# Patient Record
Sex: Female | Born: 1960
Health system: Southern US, Community
[De-identification: ages and names within clinical notes are randomized; demographics above are authoritative.]

## PROBLEM LIST (undated history)

## (undated) DIAGNOSIS — Z972 Presence of dental prosthetic device (complete) (partial): Secondary | ICD-10-CM

## (undated) DIAGNOSIS — I10 Essential (primary) hypertension: Secondary | ICD-10-CM

## (undated) DIAGNOSIS — H9190 Unspecified hearing loss, unspecified ear: Secondary | ICD-10-CM

## (undated) DIAGNOSIS — E049 Nontoxic goiter, unspecified: Secondary | ICD-10-CM

## (undated) HISTORY — PX: MULTIPLE TOOTH EXTRACTIONS: SHX2053

## (undated) HISTORY — DX: Nontoxic goiter, unspecified: E04.9

## (undated) HISTORY — PX: CYST EXCISION: SHX5701

## (undated) HISTORY — PX: TUBAL LIGATION: SHX77

## (undated) HISTORY — DX: Essential (primary) hypertension: I10

---

## 2008-02-11 ENCOUNTER — Emergency Department (HOSPITAL_BASED_OUTPATIENT_CLINIC_OR_DEPARTMENT_OTHER): Admission: EM | Admit: 2008-02-11 | Discharge: 2008-02-11 | Payer: Self-pay | Admitting: Emergency Medicine

## 2009-09-21 ENCOUNTER — Encounter: Admission: RE | Admit: 2009-09-21 | Discharge: 2009-09-21 | Payer: Self-pay | Admitting: Emergency Medicine

## 2010-06-30 ENCOUNTER — Encounter: Payer: Self-pay | Admitting: Emergency Medicine

## 2010-09-18 ENCOUNTER — Other Ambulatory Visit: Payer: Self-pay | Admitting: Emergency Medicine

## 2010-09-18 DIAGNOSIS — E041 Nontoxic single thyroid nodule: Secondary | ICD-10-CM

## 2010-09-30 ENCOUNTER — Other Ambulatory Visit: Payer: Self-pay

## 2010-09-30 ENCOUNTER — Ambulatory Visit
Admission: RE | Admit: 2010-09-30 | Discharge: 2010-09-30 | Disposition: A | Discharge status: Home / Self Care | Payer: 59 | Source: Ambulatory Visit | Attending: Emergency Medicine | Admitting: Emergency Medicine

## 2010-09-30 DIAGNOSIS — E041 Nontoxic single thyroid nodule: Secondary | ICD-10-CM

## 2010-10-03 ENCOUNTER — Other Ambulatory Visit: Payer: Self-pay | Admitting: Emergency Medicine

## 2010-10-03 DIAGNOSIS — E041 Nontoxic single thyroid nodule: Secondary | ICD-10-CM

## 2010-10-30 ENCOUNTER — Other Ambulatory Visit: Payer: Self-pay | Admitting: Interventional Radiology

## 2010-10-30 ENCOUNTER — Other Ambulatory Visit (HOSPITAL_COMMUNITY)
Admission: RE | Admit: 2010-10-30 | Discharge: 2010-10-30 | Disposition: A | Discharge status: Home / Self Care | Payer: 59 | Source: Ambulatory Visit | Attending: Interventional Radiology | Admitting: Interventional Radiology

## 2010-10-30 ENCOUNTER — Ambulatory Visit
Admission: RE | Admit: 2010-10-30 | Discharge: 2010-10-30 | Disposition: A | Discharge status: Home / Self Care | Payer: 59 | Source: Ambulatory Visit | Attending: Emergency Medicine | Admitting: Emergency Medicine

## 2010-10-30 DIAGNOSIS — E049 Nontoxic goiter, unspecified: Secondary | ICD-10-CM | POA: Insufficient documentation

## 2010-10-30 DIAGNOSIS — E041 Nontoxic single thyroid nodule: Secondary | ICD-10-CM

## 2011-09-23 ENCOUNTER — Encounter: Payer: Self-pay | Admitting: Emergency Medicine

## 2011-10-24 ENCOUNTER — Telehealth: Payer: Self-pay

## 2011-10-24 NOTE — Telephone Encounter (Signed)
Patient out of hypertension RX (Lisinopril) but next appt for CPE not until July. Requests refill until then. Advised may take 24-48 hours for response, might need to come in and be seen before a doc will refill.

## 2011-10-25 NOTE — Telephone Encounter (Signed)
PT IS VERY UPSET THAT NO ONE HAS CALLED HER REGARDING REFILLING HER HIGH BLOOD PRESSURE MEDICATION YET, CAN SOMEONE PLEASE HELP THIS PT SHE STATES THAT SHE IS COMPLETELY OUT.

## 2011-10-26 MED ORDER — LISINOPRIL-HYDROCHLOROTHIAZIDE 20-12.5 MG PO TABS: 1.0000 | ORAL_TABLET | Freq: Every day | ORAL | Status: DC

## 2011-10-26 NOTE — Telephone Encounter (Signed)
LMOM notifying patient rx sent in. 

## 2011-12-23 ENCOUNTER — Encounter: Payer: Self-pay | Admitting: Emergency Medicine

## 2011-12-23 ENCOUNTER — Other Ambulatory Visit: Payer: Self-pay | Admitting: Emergency Medicine

## 2011-12-23 ENCOUNTER — Ambulatory Visit (INDEPENDENT_AMBULATORY_CARE_PROVIDER_SITE_OTHER): Payer: 59 | Admitting: Emergency Medicine

## 2011-12-23 VITALS — BP 139/96 | HR 83 | Temp 98.3°F | Resp 18 | Ht 65.5 in | Wt 186.6 lb

## 2011-12-23 DIAGNOSIS — R141 Gas pain: Secondary | ICD-10-CM

## 2011-12-23 DIAGNOSIS — Z0000 Encounter for general adult medical examination without abnormal findings: Secondary | ICD-10-CM

## 2011-12-23 DIAGNOSIS — E049 Nontoxic goiter, unspecified: Secondary | ICD-10-CM

## 2011-12-23 DIAGNOSIS — R142 Eructation: Secondary | ICD-10-CM

## 2011-12-23 DIAGNOSIS — D219 Benign neoplasm of connective and other soft tissue, unspecified: Secondary | ICD-10-CM

## 2011-12-23 DIAGNOSIS — I10 Essential (primary) hypertension: Secondary | ICD-10-CM

## 2011-12-23 HISTORY — DX: Nontoxic goiter, unspecified: E04.9

## 2011-12-23 LAB — POCT UA - MICROSCOPIC ONLY
Bacteria, U Microscopic: NEGATIVE
Casts, Ur, LPF, POC: NEGATIVE
Crystals, Ur, HPF, POC: NEGATIVE

## 2011-12-23 LAB — COMPREHENSIVE METABOLIC PANEL
ALT: 11 U/L (ref 0–35)
Alkaline Phosphatase: 33 U/L — ABNORMAL LOW (ref 39–117)
Chloride: 105 mEq/L (ref 96–112)
Glucose, Bld: 86 mg/dL (ref 70–99)
Potassium: 3.9 mEq/L (ref 3.5–5.3)
Total Bilirubin: 0.3 mg/dL (ref 0.3–1.2)
Total Protein: 7.3 g/dL (ref 6.0–8.3)

## 2011-12-23 LAB — POCT URINALYSIS DIPSTICK
Protein, UA: 100
Spec Grav, UA: 1.025
Urobilinogen, UA: 0.2
pH, UA: 5.5

## 2011-12-23 LAB — CBC WITH DIFFERENTIAL/PLATELET
Basophils Relative: 1 % (ref 0–1)
HCT: 32.8 % — ABNORMAL LOW (ref 36.0–46.0)
Lymphs Abs: 2.1 10*3/uL (ref 0.7–4.0)
MCH: 25 pg — ABNORMAL LOW (ref 26.0–34.0)
MCV: 77.4 fL — ABNORMAL LOW (ref 78.0–100.0)
Monocytes Absolute: 0.3 10*3/uL (ref 0.1–1.0)
Neutro Abs: 2.3 10*3/uL (ref 1.7–7.7)
Neutrophils Relative %: 48 % (ref 43–77)
Platelets: 534 10*3/uL — ABNORMAL HIGH (ref 150–400)
RDW: 15.4 % (ref 11.5–15.5)
WBC: 4.7 10*3/uL (ref 4.0–10.5)

## 2011-12-23 LAB — LIPID PANEL
HDL: 52 mg/dL (ref >39)
Total CHOL/HDL Ratio: 3.8 Ratio
Triglycerides: 52 mg/dL (ref <150)

## 2011-12-23 LAB — T4, FREE: Free T4: 0.96 ng/dL (ref 0.80–1.80)

## 2011-12-23 MED ORDER — LISINOPRIL-HYDROCHLOROTHIAZIDE 20-12.5 MG PO TABS: 1.0000 | ORAL_TABLET | Freq: Every day | ORAL | Status: DC

## 2011-12-23 NOTE — Progress Notes (Signed)
@[email or web address] @  Patient ID: Grace [name] MRN: [id number], DOB: [birth year], 51 y.o. Date of Encounter: 12/23/2011, 11:20 AM  Primary Physician: No primary provider on file.  Chief Complaint: Physical (CPE)  HPI: 51 y.o. y/o female with history of noted below here for CPE.  Doing well. No issues/complaints.  LMP:  Pap: MMG: Review of Systems:  Consitutional: No fever, chills, fatigue, night sweats, lymphadenopathy, or weight changes. Eyes: No visual changes, eye redness, or discharge. ENT/Mouth: Ears: No otalgia, tinnitus, hearing loss, discharge. Nose: No congestion, rhinorrhea, sinus pain, or epistaxis. Throat: No sore throat, post nasal drip, or teeth pain. Cardiovascular: No CP, palpitations, diaphoresis, DOE, edema, orthopnea, PND. Respiratory: She has a history of exercise-induced asthma she has Dulera to take but rarely takes it.  She has a Advertising account planner inhaler  Gastrointestinal: No anorexia, dysphagia, reflux, pain, nausea, vomiting, hematemesis, diarrhea, constipation, BRBPR, or melena. She has been bothered with belching and burping Breast: No discharge, pain, swelling, or mass. Genitourinary: No dysuria, frequency, urgency, hematuria, incontinence, nocturia, she has a regular menses with heavy bleeding is known to have fibroids and is followed by her GYN. . Musculoskeletal: No decreased ROM, myalgias, stiffness, joint swelling, or weakness. Skin: No rash, erythema, lesion changes, pain, warmth, jaundice, or pruritis. Neurological: No headache, dizziness, syncope, seizures, tremors, memory loss, coordination problems, or paresthesias. Psychological: No anxiety, depression, hallucinations, SI/HI. Endocrine: No fatigue, polydipsia, polyphagia, polyuria, or known diabetes. All other systems were reviewed and are otherwise negative.  No past medical history on file.   No past surgical history on file.  Home Meds:  Prior to Admission medications   Medication Sig Start Date End Date  Taking? Authorizing Provider  lisinopril-hydrochlorothiazide (ZESTORETIC) 20-12.5 MG per tablet Take 1 tablet by mouth daily. NEEDS OFFICE VISIT/LABS 12/23/11 12/22/12 Yes Collene Gobble, MD    Allergies:  Allergies  Allergen Reactions  . Penicillins Other (See Comments)    Sisters are allergic    History   Social History  . Marital Status: Married    Spouse Name: N/A    Number of Children: N/A  . Years of Education: N/A   Occupational History  . Not on file.   Social History Main Topics  . Smoking status: Never Smoker   . Smokeless tobacco: Not on file  . Alcohol Use: No  . Drug Use: No  . Sexually Active: Yes   Other Topics Concern  . Not on file   Social History Narrative  . No narrative on file    No family history on file.  Physical Exam  Blood pressure 139/96, pulse 83, temperature 98.3 F (36.8 C), temperature source Oral, resp. rate 18, height 5' 5.5" (1.664 m), weight 186 lb 9.6 oz (84.641 kg), last menstrual period 12/18/2011, SpO2 100.00%., Body mass index is 30.58 kg/(m^2). General: Well developed, well nourished, in no acute distress. HEENT: Normocephalic, atraumatic. Conjunctiva pink, sclera non-icteric. Pupils 2 mm constricting to 1 mm, round, regular, and equally reactive to light and accomodation. EOMI. Internal auditory canal clear. TMs with good cone of light and without pathology. Nasal mucosa pink. Nares are without discharge. No sinus tenderness. Oral mucosa pink. Dentition she has had multiple caries filled . Pharynx without exudate.   Neck: Supple. Trachea midline. Her thyroid is significantly enlarged multinodular in texture . Full ROM. No lymphadenopathy. Lungs: Clear to auscultation bilaterally without wheezes, rales, or rhonchi. Breathing is of normal effort and unlabored. Cardiovascular: RRR with S1 S2. No murmurs, rubs, or gallops appreciated. Distal pulses 2+  symmetrically. No carotid or abdominal bruits  Breast: Normal no  masses  Abdomen: Soft, non-tender, non-distended with normoactive bowel sounds. No hepatosplenomegaly or masses. No rebound/guarding. No CVA tenderness. Without hernias. The top of the uterus is palpable above the pubic symphysis  Genitourinary: Not examined   Musculoskeletal: Full range of motion and 5/5 strength throughout. Without swelling, atrophy, tenderness, crepitus, or warmth. Extremities without clubbing, cyanosis, or edema. Calves supple. Skin: Warm and moist without erythema, ecchymosis, wounds, or rash. Neuro: A+Ox3. CN II-XII grossly intact. Moves all extremities spontaneously. Full sensation throughout. Normal gait. DTR 2+ throughout upper and lower extremities. Finger to nose intact. Psych:  Responds to questions appropriately with a normal affect.        Assessment/Plan:  51 y.o. y/o female here for CPE. Her blood pressure is a little high today but overall he she's been doing well. She has regular GYN appointments and is trying to decide if she will proceed with a hysterectomy. She is afraid to have a colonoscopy at the present time and also does not want to have the shingles vaccine. She is up-to-date on her tetanus. She has been having issues with belching and bloating and will go ahead and check an ultrasound of the gallbladder. She does have a significantly enlarged multinodular goiter and will do another ultrasound this year to see if there's been any change. Previous biopsy has been benign.  -  Signed, Earl Lites, MD 12/23/2011 11:20 AM

## 2011-12-23 NOTE — Progress Notes (Signed)
52 year old Black Female is here today for her annual physical. Pt states she will come back for her PAP due to not completely off her menstral cycle. Pt states she is concerned about the goiter growing on the left side of her neck. Pt states she also has noticed a change in her allergies due to a change in the weather. Pt states she having more headaches due to the allergies.Pt states she has noticed SOB when walking up stairs. Pt states she no other concerns.

## 2011-12-23 NOTE — Progress Notes (Deleted)
  Subjective:    Patient ID: Grace [name], female    DOB: Apr 17, 1961, 51 y.o.   MRN: [id number]  HPI    Review of Systems     Objective:   Physical Exam        Assessment & Plan:

## 2011-12-26 ENCOUNTER — Other Ambulatory Visit: Payer: Self-pay | Admitting: Emergency Medicine

## 2011-12-26 DIAGNOSIS — D649 Anemia, unspecified: Secondary | ICD-10-CM

## 2011-12-26 LAB — FERRITIN: Ferritin: 7 ng/mL — ABNORMAL LOW (ref 10–291)

## 2011-12-26 LAB — IRON AND TIBC
%SAT: 3 % — ABNORMAL LOW (ref 20–55)
Iron: 12 ug/dL — ABNORMAL LOW (ref 42–145)
TIBC: 444 ug/dL (ref 250–470)
UIBC: 432 ug/dL — ABNORMAL HIGH (ref 125–400)

## 2011-12-30 ENCOUNTER — Telehealth: Payer: Self-pay

## 2011-12-30 NOTE — Telephone Encounter (Signed)
Pt called back to get 2nd set of lab results. Gave her message from Dr Cleta Alberts to begin taking Ferrous Sulfate. Pt agreed but stated that it gives her constipation. Advised pt that she might want to try taking Miralax or extra fiber w/it to help prevent it and also to increase water intake and exercise if possible, and to let us know if it is causing her too much of a problem. Pt agreed and stated she has a GI appt on Aug 6.

## 2012-01-05 ENCOUNTER — Ambulatory Visit (INDEPENDENT_AMBULATORY_CARE_PROVIDER_SITE_OTHER): Payer: 59 | Admitting: Physician Assistant

## 2012-01-05 VITALS — BP 136/80 | HR 91 | Temp 98.5°F | Resp 20 | Ht 65.5 in | Wt 186.0 lb

## 2012-01-05 DIAGNOSIS — R319 Hematuria, unspecified: Secondary | ICD-10-CM

## 2012-01-05 DIAGNOSIS — R350 Frequency of micturition: Secondary | ICD-10-CM

## 2012-01-05 DIAGNOSIS — N390 Urinary tract infection, site not specified: Secondary | ICD-10-CM

## 2012-01-05 LAB — POCT UA - MICROSCOPIC ONLY
Casts, Ur, LPF, POC: NEGATIVE
Crystals, Ur, HPF, POC: NEGATIVE
Mucus, UA: NEGATIVE
Yeast, UA: NEGATIVE

## 2012-01-05 LAB — POCT URINALYSIS DIPSTICK
Bilirubin, UA: NEGATIVE
Glucose, UA: NEGATIVE
Ketones, UA: NEGATIVE
Nitrite, UA: NEGATIVE
Protein, UA: NEGATIVE
Spec Grav, UA: 1.02
Urobilinogen, UA: 0.2
pH, UA: 5.5

## 2012-01-05 MED ORDER — CIPROFLOXACIN HCL 500 MG PO TABS: 500.0000 mg | ORAL_TABLET | Freq: Two times a day (BID) | ORAL | Status: AC

## 2012-01-05 NOTE — Progress Notes (Signed)
  Subjective:    Patient ID: Grace [name], female    DOB: 27-May-1961, 51 y.o.   MRN: [id number]  HPI 51 year old female presents with 2 day history of urinary frequency and hematuria.  She is going out of town and wants to make sure she does not have a UTI.  She took OTC Azo which has helped with the urgency.  Denies abdominal pain, nausea, vomiting, flank pain, or vaginal discharge.      Review of Systems  All other systems reviewed and are negative.       Objective:   Physical Exam  Constitutional: She is oriented to person, place, and time. She appears well-developed and well-nourished.  HENT:  Head: Normocephalic and atraumatic.  Right Ear: External ear normal.  Left Ear: External ear normal.  Eyes: Conjunctivae are normal.  Cardiovascular: Normal rate, regular rhythm and normal heart sounds.   Pulmonary/Chest: Effort normal and breath sounds normal.  Abdominal: Soft. Bowel sounds are normal. There is no rebound (no CVA tenderness bilaterally).  Neurological: She is alert and oriented to person, place, and time.  Psychiatric: She has a normal mood and affect. Her behavior is normal. Judgment and thought content normal.          Assessment & Plan:   1. Hematuria  POCT UA - Microscopic Only, POCT urinalysis dipstick  2. Urinary frequency  ciprofloxacin (CIPRO) 500 MG tablet, Urine culture  Will treat with Cipro 500 mg bid x 5 days  Urine culture sent.    Please call patient on her cell phone.

## 2012-01-08 LAB — URINE CULTURE: Colony Count: 30000

## 2012-01-13 ENCOUNTER — Other Ambulatory Visit: Payer: 59

## 2012-01-21 ENCOUNTER — Ambulatory Visit
Admission: RE | Admit: 2012-01-21 | Discharge: 2012-01-21 | Disposition: A | Discharge status: Home / Self Care | Payer: 59 | Source: Ambulatory Visit | Attending: Emergency Medicine | Admitting: Emergency Medicine

## 2012-01-21 DIAGNOSIS — R142 Eructation: Secondary | ICD-10-CM

## 2012-01-21 DIAGNOSIS — E049 Nontoxic goiter, unspecified: Secondary | ICD-10-CM

## 2012-01-26 ENCOUNTER — Other Ambulatory Visit: Payer: Self-pay | Admitting: Radiology

## 2012-01-26 DIAGNOSIS — K8020 Calculus of gallbladder without cholecystitis without obstruction: Secondary | ICD-10-CM

## 2012-02-16 ENCOUNTER — Ambulatory Visit (INDEPENDENT_AMBULATORY_CARE_PROVIDER_SITE_OTHER): Payer: 59 | Admitting: Surgery

## 2012-02-24 ENCOUNTER — Encounter (INDEPENDENT_AMBULATORY_CARE_PROVIDER_SITE_OTHER): Payer: Self-pay | Admitting: Surgery

## 2012-05-19 ENCOUNTER — Ambulatory Visit (INDEPENDENT_AMBULATORY_CARE_PROVIDER_SITE_OTHER): Payer: 59 | Admitting: Family Medicine

## 2012-05-19 ENCOUNTER — Other Ambulatory Visit: Payer: Self-pay | Admitting: Family Medicine

## 2012-05-19 VITALS — BP 130/86 | HR 98 | Temp 97.9°F | Resp 16 | Ht 66.0 in | Wt 188.0 lb

## 2012-05-19 DIAGNOSIS — E663 Overweight: Secondary | ICD-10-CM

## 2012-05-19 DIAGNOSIS — D473 Essential (hemorrhagic) thrombocythemia: Secondary | ICD-10-CM

## 2012-05-19 DIAGNOSIS — I10 Essential (primary) hypertension: Secondary | ICD-10-CM

## 2012-05-19 DIAGNOSIS — D75839 Thrombocytosis, unspecified: Secondary | ICD-10-CM

## 2012-05-19 DIAGNOSIS — E049 Nontoxic goiter, unspecified: Secondary | ICD-10-CM

## 2012-05-19 LAB — POCT CBC
HCT, POC: 40.9 % (ref 37.7–47.9)
Hemoglobin: 12.2 g/dL (ref 12.2–16.2)
Lymph, poc: 2.5 (ref 0.6–3.4)
MCH, POC: 25.2 pg — AB (ref 27–31.2)
MID (cbc): 0.5 (ref 0–0.9)
POC Granulocyte: 2.8 (ref 2–6.9)
POC LYMPH PERCENT: 42.6 %L (ref 10–50)
RDW, POC: 16 %

## 2012-05-19 LAB — COMPREHENSIVE METABOLIC PANEL
ALT: 15 U/L (ref 0–35)
Albumin: 4.3 g/dL (ref 3.5–5.2)
Alkaline Phosphatase: 40 U/L (ref 39–117)
BUN: 9 mg/dL (ref 6–23)
Chloride: 103 mEq/L (ref 96–112)
Potassium: 4.1 mEq/L (ref 3.5–5.3)
Sodium: 139 mEq/L (ref 135–145)
Total Bilirubin: 0.3 mg/dL (ref 0.3–1.2)
Total Protein: 7.9 g/dL (ref 6.0–8.3)

## 2012-05-19 LAB — TSH: TSH: 0.334 u[IU]/mL — ABNORMAL LOW (ref 0.350–4.500)

## 2012-05-19 LAB — LIPID PANEL
HDL: 58 mg/dL (ref >39)
Total CHOL/HDL Ratio: 3.7 Ratio

## 2012-05-19 MED ORDER — LISINOPRIL-HYDROCHLOROTHIAZIDE 20-12.5 MG PO TABS: 1.0000 | ORAL_TABLET | Freq: Every day | ORAL | Status: DC

## 2012-05-19 NOTE — Progress Notes (Signed)
[facility] [address] [place] [address] [phone]  Date:  05/19/2012   Name:  Grace [name]   DOB:  [birth year]   MRN:  [id number]  PCP:  No primary provider on file.    Chief Complaint: Medication Refill   History of Present Illness:  Grace [name] is a 51 y.o. very pleasant female patient who presents with the following:  She is here today to refill her medication.  She has an appt to see a surgeon about her gallstones but has been putting it off because she is not sure when she wants to schedule her surgery.   Her BP looks good today.  She is feeling well and has no other particular concerns . She does have a goiter which she is aware of.  She had an ultrasound for this over the summer She had a 1/2 banana so far today- that is all.   She declines a flu shot today.   Patient Active Problem List  Diagnosis  . Hypertension  . Goiter  . Fibroids    Past Medical History  Diagnosis Date  . Hypertension     Past Surgical History  Procedure Date  . Tubal ligation     History  Substance Use Topics  . Smoking status: Never Smoker   . Smokeless tobacco: Not on file  . Alcohol Use: No    Family History  Problem Relation Age of Onset  . Hypertension Mother     Allergies  Allergen Reactions  . Penicillins Other (See Comments)    Sisters are allergic    Medication list has been reviewed and updated.  Current Outpatient Prescriptions on File Prior to Visit  Medication Sig Dispense Refill  . lisinopril-hydrochlorothiazide (ZESTORETIC) 20-12.5 MG per tablet Take 1 tablet by mouth daily. NEEDS OFFICE VISIT/LABS  30 tablet  11  . Iron 66 MG TABS Take 65 mg by mouth 2 (two) times daily.      . phenazopyridine (PYRIDIUM) 97 MG tablet Take 97 mg by mouth 2 (two) times daily.        Review of Systems:  As per HPI- otherwise negative.   Physical Examination: Filed Vitals:   05/19/12 0821  BP: 130/86  Pulse: 98  Temp: 97.9 F (36.6  C)  Resp: 16   Filed Vitals:   05/19/12 0821  Height: 5\' 6"  (1.676 m)  Weight: 188 lb (85.276 kg)   Body mass index is 30.34 kg/(m^2). Ideal Body Weight: Weight in (lb) to have BMI = 25: 154.6   GEN: WDWN, NAD, Non-toxic, A & O x 3, overweight HEENT: Atraumatic, Normocephalic. Neck supple. She does have a non- tender goiter with no nodules palpated. No LAD. Bilateral TM wnl, oropharynx normal.  PEERL,EOMI.   Ears and Nose: No external deformity. CV: RRR, No M/G/R. No JVD. No thrill. No extra heart sounds. PULM: CTA B, no wheezes, crackles, rhonchi. No retractions. No resp. distress. No accessory muscle use. ABD: S, NT, ND. No rebound. No HSM. EXTR: No c/c/e NEURO Normal gait.  PSYCH: Normally interactive. Conversant. Not depressed or anxious appearing.  Calm demeanor. ;ll  Results for orders placed in visit on 05/19/12  POCT CBC      Component Value Range   WBC 5.8  4.6 - 10.2 K/uL   Lymph, poc 2.5  0.6 - 3.4   POC LYMPH PERCENT 42.6  10 - 50 %L   MID (cbc) 0.5  0 - 0.9   POC MID %  8.8  0 - 12 %M   POC Granulocyte 2.8  2 - 6.9   Granulocyte percent 48.6  37 - 80 %G   RBC 4.85  4.04 - 5.48 M/uL   Hemoglobin 12.2  12.2 - 16.2 g/dL   HCT, POC 16.1  09.6 - 47.9 %   MCV 84.3  80 - 97 fL   MCH, POC 25.2 (*) 27 - 31.2 pg   MCHC 29.8 (*) 31.8 - 35.4 g/dL   RDW, POC 04.5     Platelet Count, POC 515 (*) 142 - 424 K/uL   MPV 8.5  0 - 99.8 fL    Assessment and Plan: 1. Hypertension  lisinopril-hydrochlorothiazide (ZESTORETIC) 20-12.5 MG per tablet, POCT CBC, Comprehensive metabolic panel, Lipid panel  2. Goiter  TSH  3. Overweight  Lipid panel   BP control ok,  Refused flu shot today Will plan further follow- up pending labs. Platelets a bit high- will discuss with other labs.   Abbe Amsterdam, MD

## 2012-05-19 NOTE — Patient Instructions (Addendum)
I will be in touch with you regarding your lab results- take care

## 2012-05-20 ENCOUNTER — Encounter: Payer: Self-pay | Admitting: Family Medicine

## 2012-05-20 NOTE — Addendum Note (Signed)
Addended by: Abbe Amsterdam C on: 05/20/2012 03:32 PM   Modules accepted: Orders

## 2012-05-21 ENCOUNTER — Encounter: Payer: Self-pay | Admitting: Family Medicine

## 2012-05-21 LAB — T4, FREE: Free T4: 1 ng/dL (ref 0.80–1.80)

## 2012-05-21 NOTE — Addendum Note (Signed)
Addended by: Abbe Amsterdam C on: 05/21/2012 02:07 PM   Modules accepted: Orders

## 2012-09-09 ENCOUNTER — Encounter: Payer: Self-pay | Admitting: Emergency Medicine

## 2013-01-02 ENCOUNTER — Other Ambulatory Visit: Payer: Self-pay | Admitting: Emergency Medicine

## 2013-05-27 ENCOUNTER — Encounter: Payer: Self-pay | Admitting: Family Medicine

## 2013-11-11 ENCOUNTER — Other Ambulatory Visit: Payer: Self-pay | Admitting: Family Medicine

## 2013-11-14 ENCOUNTER — Telehealth: Payer: Self-pay

## 2013-11-14 NOTE — Telephone Encounter (Signed)
Pt called states she is completely out of bloodpressure medication and needs a refill' Uses [facility] on [address]

## 2013-11-15 ENCOUNTER — Ambulatory Visit (INDEPENDENT_AMBULATORY_CARE_PROVIDER_SITE_OTHER): Payer: 59 | Admitting: Internal Medicine

## 2013-11-15 VITALS — BP 142/96 | HR 85 | Temp 98.2°F | Resp 16 | Ht 65.0 in | Wt 188.0 lb

## 2013-11-15 DIAGNOSIS — J3489 Other specified disorders of nose and nasal sinuses: Secondary | ICD-10-CM

## 2013-11-15 DIAGNOSIS — I10 Essential (primary) hypertension: Secondary | ICD-10-CM

## 2013-11-15 MED ORDER — LISINOPRIL-HYDROCHLOROTHIAZIDE 20-12.5 MG PO TABS: 1.0000 | ORAL_TABLET | Freq: Every day | ORAL | Status: DC

## 2013-11-15 NOTE — Progress Notes (Signed)
   Subjective:    Patient ID: Grace [name], female    DOB: Oct 13, 1960, 53 y.o.   MRN: [id number]  HPI    Review of Systems     Objective:   Physical Exam        Assessment & Plan:

## 2013-11-15 NOTE — Patient Instructions (Signed)
DASH Diet  The DASH diet stands for "Dietary Approaches to Stop Hypertension." It is a healthy eating plan that has been shown to reduce high blood pressure (hypertension) in as little as 14 days, while also possibly providing other significant health benefits. These other health benefits include reducing the risk of breast cancer after menopause and reducing the risk of type 2 diabetes, heart disease, colon cancer, and stroke. Health benefits also include weight loss and slowing kidney failure in patients with chronic kidney disease.   DIET GUIDELINES  · Limit salt (sodium). Your diet should contain less than 1500 mg of sodium daily.  · Limit refined or processed carbohydrates. Your diet should include mostly whole grains. Desserts and added sugars should be used sparingly.  · Include small amounts of heart-healthy fats. These types of fats include nuts, oils, and tub margarine. Limit saturated and trans fats. These fats have been shown to be harmful in the body.  CHOOSING FOODS   The following food groups are based on a 2000 calorie diet. See your Registered Dietitian for individual calorie needs.  Grains and Grain Products (6 to 8 servings daily)  · Eat More Often: Whole-wheat bread, brown rice, whole-grain or wheat pasta, quinoa, popcorn without added fat or salt (air popped).  · Eat Less Often: White bread, white pasta, white rice, cornbread.  Vegetables (4 to 5 servings daily)  · Eat More Often: Fresh, frozen, and canned vegetables. Vegetables may be raw, steamed, roasted, or grilled with a minimal amount of fat.  · Eat Less Often/Avoid: Creamed or fried vegetables. Vegetables in a cheese sauce.  Fruit (4 to 5 servings daily)  · Eat More Often: All fresh, canned (in natural juice), or frozen fruits. Dried fruits without added sugar. One hundred percent fruit juice (½ cup [237 mL] daily).  · Eat Less Often: Dried fruits with added sugar. Canned fruit in light or heavy syrup.  Lean Meats, Fish, and Poultry (2  servings or less daily. One serving is 3 to 4 oz [85-114 g]).  · Eat More Often: Ninety percent or leaner ground beef, tenderloin, sirloin. Round cuts of beef, chicken breast, turkey breast. All fish. Grill, bake, or broil your meat. Nothing should be fried.  · Eat Less Often/Avoid: Fatty cuts of meat, turkey, or chicken leg, thigh, or wing. Fried cuts of meat or fish.  Dairy (2 to 3 servings)  · Eat More Often: Low-fat or fat-free milk, low-fat plain or light yogurt, reduced-fat or part-skim cheese.  · Eat Less Often/Avoid: Milk (whole, 2%). Whole milk yogurt. Full-fat cheeses.  Nuts, Seeds, and Legumes (4 to 5 servings per week)  · Eat More Often: All without added salt.  · Eat Less Often/Avoid: Salted nuts and seeds, canned beans with added salt.  Fats and Sweets (limited)  · Eat More Often: Vegetable oils, tub margarines without trans fats, sugar-free gelatin. Mayonnaise and salad dressings.  · Eat Less Often/Avoid: Coconut oils, palm oils, butter, stick margarine, cream, half and half, cookies, candy, pie.  FOR MORE INFORMATION  The Dash Diet Eating Plan: www.dashdiet.org  Document Released: 05/15/2011 Document Revised: 08/18/2011 Document Reviewed: 05/15/2011  Exit[employer]® Patient Information ©2014 [employer].

## 2013-11-15 NOTE — Progress Notes (Signed)
   Subjective:    Patient ID: Grace [name], female    DOB: [birth year], 53 y.o.   MRN: [id number]  HPI  Patient presents today for hypertension medication refill. She is currently Lisinopril-HCTZ 20/12.5. She states that her physical is scheduled on August 20 with Dr. Everlene Farrier. She is out of HTN medication and has not been taking it for the last 5 days.    Review of Systems     Objective:   Physical Exam  Constitutional: She is oriented to person, place, and time. She appears well-developed and well-nourished. No distress.  HENT:  Head: Normocephalic.  Nose: Mucosal edema and rhinorrhea present.  Eyes: EOM are normal.  Neck: Normal range of motion. Neck supple.  Cardiovascular: Normal rate, regular rhythm and normal heart sounds.   Pulmonary/Chest: Effort normal and breath sounds normal.  Lymphadenopathy:    She has no cervical adenopathy.  Neurological: She is alert and oriented to person, place, and time. She exhibits normal muscle tone. Coordination normal.  Psychiatric: She has a normal mood and affect. Her behavior is normal. Thought content normal.    Does not want blood work tonite      Assessment & Plan:  Refill lisinopril/hct CPE with Dr. Everlene Farrier August

## 2013-11-15 NOTE — Telephone Encounter (Signed)
LM for pt- shee needs to RTC for evaluation have not seen pt since 2013

## 2014-01-26 ENCOUNTER — Encounter: Payer: Self-pay | Admitting: Emergency Medicine

## 2014-01-26 ENCOUNTER — Ambulatory Visit (INDEPENDENT_AMBULATORY_CARE_PROVIDER_SITE_OTHER): Payer: 59 | Admitting: Emergency Medicine

## 2014-01-26 VITALS — BP 152/120 | HR 83 | Temp 98.1°F | Resp 16 | Ht 66.0 in | Wt 188.0 lb

## 2014-01-26 DIAGNOSIS — Z0000 Encounter for general adult medical examination without abnormal findings: Secondary | ICD-10-CM

## 2014-01-26 DIAGNOSIS — Z1211 Encounter for screening for malignant neoplasm of colon: Secondary | ICD-10-CM

## 2014-01-26 DIAGNOSIS — E785 Hyperlipidemia, unspecified: Secondary | ICD-10-CM

## 2014-01-26 DIAGNOSIS — D649 Anemia, unspecified: Secondary | ICD-10-CM

## 2014-01-26 DIAGNOSIS — J3489 Other specified disorders of nose and nasal sinuses: Secondary | ICD-10-CM

## 2014-01-26 DIAGNOSIS — Z124 Encounter for screening for malignant neoplasm of cervix: Secondary | ICD-10-CM

## 2014-01-26 DIAGNOSIS — E049 Nontoxic goiter, unspecified: Secondary | ICD-10-CM

## 2014-01-26 DIAGNOSIS — I10 Essential (primary) hypertension: Secondary | ICD-10-CM

## 2014-01-26 LAB — CBC WITH DIFFERENTIAL/PLATELET
BASOS ABS: 0 10*3/uL (ref 0.0–0.1)
Basophils Relative: 0 % (ref 0–1)
Eosinophils Absolute: 0.1 10*3/uL (ref 0.0–0.7)
Eosinophils Relative: 1 % (ref 0–5)
HEMATOCRIT: 39.7 % (ref 36.0–46.0)
HEMOGLOBIN: 13.7 g/dL (ref 12.0–15.0)
LYMPHS PCT: 46 % (ref 12–46)
Lymphs Abs: 2.3 10*3/uL (ref 0.7–4.0)
MCH: 29.6 pg (ref 26.0–34.0)
MCHC: 34.5 g/dL (ref 30.0–36.0)
MCV: 85.7 fL (ref 78.0–100.0)
MONO ABS: 0.4 10*3/uL (ref 0.1–1.0)
MONOS PCT: 7 % (ref 3–12)
NEUTROS ABS: 2.3 10*3/uL (ref 1.7–7.7)
Neutrophils Relative %: 46 % (ref 43–77)
Platelets: 409 10*3/uL — ABNORMAL HIGH (ref 150–400)
RBC: 4.63 MIL/uL (ref 3.87–5.11)
RDW: 13.5 % (ref 11.5–15.5)
WBC: 5 10*3/uL (ref 4.0–10.5)

## 2014-01-26 LAB — COMPLETE METABOLIC PANEL WITH GFR
ALT: 15 U/L (ref 0–35)
AST: 19 U/L (ref 0–37)
Albumin: 4.2 g/dL (ref 3.5–5.2)
Alkaline Phosphatase: 39 U/L (ref 39–117)
BUN: 8 mg/dL (ref 6–23)
CO2: 29 mEq/L (ref 19–32)
Calcium: 9.9 mg/dL (ref 8.4–10.5)
Chloride: 103 mEq/L (ref 96–112)
Creat: 0.89 mg/dL (ref 0.50–1.10)
GFR, Est African American: 85 mL/min
GFR, Est Non African American: 74 mL/min
Glucose, Bld: 80 mg/dL (ref 70–99)
Potassium: 4.1 mEq/L (ref 3.5–5.3)
Sodium: 139 mEq/L (ref 135–145)
Total Bilirubin: 0.4 mg/dL (ref 0.2–1.2)
Total Protein: 8 g/dL (ref 6.0–8.3)

## 2014-01-26 LAB — LIPID PANEL
Cholesterol: 222 mg/dL — ABNORMAL HIGH (ref 0–200)
HDL: 58 mg/dL (ref >39)
LDL Cholesterol: 153 mg/dL — ABNORMAL HIGH (ref 0–99)
Total CHOL/HDL Ratio: 3.8 Ratio
Triglycerides: 55 mg/dL (ref <150)
VLDL: 11 mg/dL (ref 0–40)

## 2014-01-26 LAB — TSH: TSH: 0.357 u[IU]/mL (ref 0.350–4.500)

## 2014-01-26 LAB — IRON AND TIBC
%SAT: 13 % — ABNORMAL LOW (ref 20–55)
IRON: 53 ug/dL (ref 42–145)
TIBC: 403 ug/dL (ref 250–470)
UIBC: 350 ug/dL (ref 125–400)

## 2014-01-26 LAB — IFOBT (OCCULT BLOOD): IFOBT: NEGATIVE

## 2014-01-26 MED ORDER — LISINOPRIL-HYDROCHLOROTHIAZIDE 20-12.5 MG PO TABS: 1.0000 | ORAL_TABLET | Freq: Every day | ORAL | Status: DC

## 2014-01-26 NOTE — Progress Notes (Addendum)
Subjective:  This chart was scribed for Arlyss Queen, MD by Donato Schultz, Medical Scribe. This patient was seen in Room 22 and the patient's care was started at 9:42 AM.   Patient ID: Grace [name], female    DOB: September 08, 1960, 53 y.o.   MRN: [id number]  HPI HPI Comments: Grace [name] is a 53 y.o. female with a history of hypertension who presents to the [facility] for a physical exam.    She has not taken her blood pressure medication which she contributes to her blood pressure reading today.  She has been feeling well otherwise and realizes that she has to start exercising again.   She stopped seeing her OB/GYN annually after reading that she did not have to receive a PAP smear every year.  She sees two different OB/GYN doctors - Dr. Melba Coon and Dr. Edwyna Ready.  She has not received a mammogram this year but is scheduled for one next month.  She has never had a colonoscopy.  Her last ultrasound of her goiter was 2 years ago.  Her last TDAP was in 2009.    Past Medical History  Diagnosis Date  . Hypertension    Past Surgical History  Procedure Laterality Date  . Tubal ligation     Family History  Problem Relation Age of Onset  . Hypertension Mother    History   Social History  . Marital Status: Married    Spouse Name: N/A    Number of Children: N/A  . Years of Education: N/A   Occupational History  . Not on file.   Social History Main Topics  . Smoking status: Never Smoker   . Smokeless tobacco: Not on file  . Alcohol Use: No  . Drug Use: No  . Sexual Activity: Yes   Other Topics Concern  . Not on file   Social History Narrative  . No narrative on file   Allergies  Allergen Reactions  . Penicillins Other (See Comments)    Sisters are allergic    Review of Systems  All other systems reviewed and are negative.    Objective:  Physical Exam  Nursing note and vitals reviewed. Constitutional: She is oriented to person, place, and time. She  appears well-developed and well-nourished.  HENT:  Head: Normocephalic and atraumatic.  Right Ear: External ear normal.  Left Ear: External ear normal.  Nose: Nose normal.  Mouth/Throat: Oropharynx is clear and moist.  Eyes: Conjunctivae and EOM are normal. Pupils are equal, round, and reactive to light.  Neck: Normal range of motion. Neck supple.  Large multinodular goiter.    Cardiovascular: Normal rate, regular rhythm and normal heart sounds.  Exam reveals no gallop and no friction rub.   No murmur heard. Pulmonary/Chest: Effort normal and breath sounds normal. No respiratory distress. She has no wheezes. She has no rales. She exhibits no mass.  Abdominal: Soft. Bowel sounds are normal. There is no tenderness.  Genitourinary:  10 week size lobulated uterus which is non tender.  Cervix appeared normal.  No adnexal masses.  Rectal exam revealed no masses.  Musculoskeletal: Normal range of motion.  Lymphadenopathy:    She has no cervical adenopathy.  Neurological: She is alert and oriented to person, place, and time.  Skin: Skin is warm and dry.  Psychiatric: She has a normal mood and affect. Her behavior is normal.   Meds ordered this encounter  Medications  . lisinopril-hydrochlorothiazide (PRINZIDE,ZESTORETIC) 20-12.5 MG per tablet    Sig:  Take 1 tablet by mouth daily.    Dispense:  90 tablet    Refill:  3   BP 160/120 in exam room. Results for orders placed in visit on 01/26/14  IFOBT (OCCULT BLOOD)      Result Value Ref Range   IFOBT Negative     BP 152/120  Pulse 83  Temp(Src) 98.1 F (36.7 C) (Oral)  Resp 16  Ht 5\' 6"  (1.676 m)  Wt 188 lb (85.276 kg)  BMI 30.36 kg/m2  SpO2 98%  LMP 04/10/2012 Assessment & Plan:  Patient not taken her blood pressure medication. She agrees to not take it. We'll do an ultrasound of her thyroid to make sure it is not changing. Referral made to GI to discuss alternative colon cancer screening that does not involve a colonoscopy. She  declined a flu shot.  I personally performed the services described in this documentation, which was scribed in my presence. The recorded information has been reviewed and is accurate.

## 2014-01-26 NOTE — Patient Instructions (Signed)

## 2014-01-27 LAB — PAP IG W/ RFLX HPV ASCU

## 2014-03-06 ENCOUNTER — Encounter: Payer: Self-pay | Admitting: Emergency Medicine

## 2014-07-27 ENCOUNTER — Ambulatory Visit: Payer: 59 | Admitting: Emergency Medicine

## 2014-08-08 ENCOUNTER — Ambulatory Visit (INDEPENDENT_AMBULATORY_CARE_PROVIDER_SITE_OTHER): Payer: 59 | Admitting: Emergency Medicine

## 2014-08-08 VITALS — BP 138/93 | HR 92 | Temp 98.4°F | Resp 16 | Ht 65.5 in | Wt 181.0 lb

## 2014-08-08 DIAGNOSIS — E049 Nontoxic goiter, unspecified: Secondary | ICD-10-CM | POA: Diagnosis not present

## 2014-08-08 DIAGNOSIS — I10 Essential (primary) hypertension: Secondary | ICD-10-CM

## 2014-08-08 NOTE — Progress Notes (Signed)
   Subjective:    Patient ID: Grace [name], female    DOB: [birth year], 54 y.o.   MRN: [id number] This chart was scribed for Arlyss Queen, MD by Zola Button, Medical Scribe. This patient was seen in room 23 and the patient's care was started at 2:51 PM.   HPI HPI Comments: Grace [name] is a 54 y.o. female with a hx of HTN who presents to the [facility] for a follow-up. Patient states she has been compliant with her blood pressure medications. She has not eaten or taken her medications today. Patient reports having some congestion that she attributes to the weather. She denies coughing, cramps, chest pain, leg swelling and other side effects from medications. Patient plans to become a vegetarian. She plans to start exercising again starting in the spring.   Review of Systems  HENT: Positive for congestion.   Respiratory: Negative for cough.   Cardiovascular: Negative for chest pain and leg swelling.  Gastrointestinal: Negative for abdominal pain.       Objective:   Physical Exam CONSTITUTIONAL: Well developed/well nourished HEAD: Normocephalic/atraumatic EYES: EOM/PERRL ENMT: Mucous membranes moist NECK: supple no meningeal signs SPINE: entire spine nontender CV: S1/S2 noted, no murmurs/rubs/gallops noted. Repeat BP: Right arm 140/100. Left arm 140/90. LUNGS: Lungs are clear to auscultation bilaterally, no apparent distress ABDOMEN: soft, nontender, no rebound or guarding GU: no cva tenderness NEURO: Pt is awake/alert, moves all extremitiesx4 EXTREMITIES: pulses normal, full ROM SKIN: warm, color normal PSYCH: no abnormalities of mood noted     Assessment & Plan:  Patient's blood pressure was not at optimal level but I was hesitant to add medications. She was trying to get off of medications. She has decided to go the holistic approach with modifications in diet. She is agreeable to start exercising. So no changes were made in her medications will recheck in 6  months. Of note her repeat ultrasound the thyroid showed no change from previous she has multiple nodules on the left side consistent with a multinodular goiter.I personally performed the services described in this documentation, which was scribed in my presence. The recorded information has been reviewed and is accurate.I personally performed the services described in this documentation, which was scribed in my presence. The recorded information has been reviewed and is accurate.

## 2015-01-27 ENCOUNTER — Other Ambulatory Visit: Payer: Self-pay | Admitting: Internal Medicine

## 2015-02-01 ENCOUNTER — Telehealth: Payer: Self-pay

## 2015-02-01 ENCOUNTER — Encounter: Payer: 59 | Admitting: Emergency Medicine

## 2015-02-01 NOTE — Telephone Encounter (Signed)
LMVM for patient to CB to schedule appointment with Dr. Everlene Farrier for a cpe.

## 2015-07-10 ENCOUNTER — Encounter: Payer: Self-pay | Admitting: Emergency Medicine

## 2015-07-10 ENCOUNTER — Ambulatory Visit (INDEPENDENT_AMBULATORY_CARE_PROVIDER_SITE_OTHER): Payer: Commercial Managed Care - PPO | Admitting: Emergency Medicine

## 2015-07-10 ENCOUNTER — Telehealth: Payer: Self-pay | Admitting: Family Medicine

## 2015-07-10 VITALS — BP 169/94 | HR 92 | Temp 98.3°F | Resp 16 | Ht 66.0 in | Wt 180.0 lb

## 2015-07-10 DIAGNOSIS — H9190 Unspecified hearing loss, unspecified ear: Secondary | ICD-10-CM | POA: Insufficient documentation

## 2015-07-10 DIAGNOSIS — I10 Essential (primary) hypertension: Secondary | ICD-10-CM

## 2015-07-10 DIAGNOSIS — Z1211 Encounter for screening for malignant neoplasm of colon: Secondary | ICD-10-CM | POA: Diagnosis not present

## 2015-07-10 DIAGNOSIS — Z0000 Encounter for general adult medical examination without abnormal findings: Secondary | ICD-10-CM

## 2015-07-10 DIAGNOSIS — H9193 Unspecified hearing loss, bilateral: Secondary | ICD-10-CM | POA: Diagnosis not present

## 2015-07-10 DIAGNOSIS — E785 Hyperlipidemia, unspecified: Secondary | ICD-10-CM

## 2015-07-10 DIAGNOSIS — Z1239 Encounter for other screening for malignant neoplasm of breast: Secondary | ICD-10-CM | POA: Diagnosis not present

## 2015-07-10 DIAGNOSIS — E049 Nontoxic goiter, unspecified: Secondary | ICD-10-CM | POA: Diagnosis not present

## 2015-07-10 DIAGNOSIS — Z1159 Encounter for screening for other viral diseases: Secondary | ICD-10-CM

## 2015-07-10 LAB — THYROID PANEL WITH TSH
FREE THYROXINE INDEX: 1.9 (ref 1.4–3.8)
T3 UPTAKE: 28 % (ref 22–35)
T4 TOTAL: 6.7 ug/dL (ref 4.5–12.0)
TSH: 0.326 u[IU]/mL — AB (ref 0.350–4.500)

## 2015-07-10 LAB — POCT URINALYSIS DIP (MANUAL ENTRY)
BILIRUBIN UA: NEGATIVE
Bilirubin, UA: NEGATIVE
Glucose, UA: NEGATIVE
LEUKOCYTES UA: NEGATIVE
NITRITE UA: NEGATIVE
PROTEIN UA: NEGATIVE
Spec Grav, UA: 1.02
UROBILINOGEN UA: 0.2
pH, UA: 6.5

## 2015-07-10 LAB — LIPID PANEL
Cholesterol: 209 mg/dL — ABNORMAL HIGH (ref 125–200)
HDL: 67 mg/dL (ref >=46)
LDL CALC: 130 mg/dL — AB (ref <130)
Total CHOL/HDL Ratio: 3.1 Ratio (ref <=5.0)
Triglycerides: 60 mg/dL (ref <150)
VLDL: 12 mg/dL (ref <30)

## 2015-07-10 LAB — CBC WITH DIFFERENTIAL/PLATELET
BASOS ABS: 0 10*3/uL (ref 0.0–0.1)
Basophils Relative: 1 % (ref 0–1)
EOS PCT: 2 % (ref 0–5)
Eosinophils Absolute: 0.1 10*3/uL (ref 0.0–0.7)
HEMATOCRIT: 40.8 % (ref 36.0–46.0)
Hemoglobin: 13.6 g/dL (ref 12.0–15.0)
LYMPHS ABS: 2 10*3/uL (ref 0.7–4.0)
LYMPHS PCT: 45 % (ref 12–46)
MCH: 28.9 pg (ref 26.0–34.0)
MCHC: 33.3 g/dL (ref 30.0–36.0)
MCV: 86.6 fL (ref 78.0–100.0)
MONOS PCT: 7 % (ref 3–12)
MPV: 9.4 fL (ref 8.6–12.4)
Monocytes Absolute: 0.3 10*3/uL (ref 0.1–1.0)
Neutro Abs: 2 10*3/uL (ref 1.7–7.7)
Neutrophils Relative %: 45 % (ref 43–77)
Platelets: 369 10*3/uL (ref 150–400)
RBC: 4.71 MIL/uL (ref 3.87–5.11)
RDW: 13.3 % (ref 11.5–15.5)
WBC: 4.5 10*3/uL (ref 4.0–10.5)

## 2015-07-10 LAB — POC MICROSCOPIC URINALYSIS (UMFC): Mucus: ABSENT

## 2015-07-10 LAB — COMPLETE METABOLIC PANEL WITH GFR
ALBUMIN: 4.2 g/dL (ref 3.6–5.1)
ALK PHOS: 38 U/L (ref 33–130)
ALT: 11 U/L (ref 6–29)
AST: 16 U/L (ref 10–35)
BUN: 10 mg/dL (ref 7–25)
CO2: 29 mmol/L (ref 20–31)
CREATININE: 0.9 mg/dL (ref 0.50–1.05)
Calcium: 9.7 mg/dL (ref 8.6–10.4)
Chloride: 103 mmol/L (ref 98–110)
GFR, EST AFRICAN AMERICAN: 83 mL/min (ref >=60)
GFR, Est Non African American: 72 mL/min (ref >=60)
Glucose, Bld: 77 mg/dL (ref 65–99)
POTASSIUM: 3.7 mmol/L (ref 3.5–5.3)
Sodium: 140 mmol/L (ref 135–146)
Total Bilirubin: 0.6 mg/dL (ref 0.2–1.2)
Total Protein: 7.2 g/dL (ref 6.1–8.1)

## 2015-07-10 LAB — HEPATITIS C ANTIBODY: HCV Ab: NEGATIVE

## 2015-07-10 MED ORDER — AMLODIPINE BESYLATE 5 MG PO TABS: 5.0000 mg | ORAL_TABLET | Freq: Every day | ORAL | Status: DC

## 2015-07-10 MED ORDER — LISINOPRIL-HYDROCHLOROTHIAZIDE 20-12.5 MG PO TABS: ORAL_TABLET | ORAL | Status: DC

## 2015-07-10 NOTE — Telephone Encounter (Signed)
lmom to find out if she had let paper work for [employer] to fill out please let Angie know

## 2015-07-10 NOTE — Patient Instructions (Signed)
DASH Eating Plan  DASH stands for "Dietary Approaches to Stop Hypertension." The DASH eating plan is a healthy eating plan that has been shown to reduce high blood pressure (hypertension). Additional health benefits may include reducing the risk of type 2 diabetes mellitus, heart disease, and stroke. The DASH eating plan may also help with weight loss.  WHAT DO I NEED TO KNOW ABOUT THE DASH EATING PLAN?  For the DASH eating plan, you will follow these general guidelines:  · Choose foods with a percent daily value for sodium of less than 5% (as listed on the food label).  · Use salt-free seasonings or herbs instead of table salt or sea salt.  · Check with your health care provider or pharmacist before using salt substitutes.  · Eat lower-sodium products, often labeled as "lower sodium" or "no salt added."  · Eat fresh foods.  · Eat more vegetables, fruits, and low-fat dairy products.  · Choose whole grains. Look for the word "whole" as the first word in the ingredient list.  · Choose fish and skinless chicken or turkey more often than red meat. Limit fish, poultry, and meat to 6 oz (170 g) each day.  · Limit sweets, desserts, sugars, and sugary drinks.  · Choose heart-healthy fats.  · Limit cheese to 1 oz (28 g) per day.  · Eat more home-cooked food and less restaurant, buffet, and fast food.  · Limit fried foods.  · Cook foods using methods other than frying.  · Limit canned vegetables. If you do use them, rinse them well to decrease the sodium.  · When eating at a restaurant, ask that your food be prepared with less salt, or no salt if possible.  WHAT FOODS CAN I EAT?  Seek help from a dietitian for individual calorie needs.  Grains  Whole grain or whole wheat bread. Brown rice. Whole grain or whole wheat pasta. Quinoa, bulgur, and whole grain cereals. Low-sodium cereals. Corn or whole wheat flour tortillas. Whole grain cornbread. Whole grain crackers. Low-sodium crackers.  Vegetables  Fresh or frozen vegetables  (raw, steamed, roasted, or grilled). Low-sodium or reduced-sodium tomato and vegetable juices. Low-sodium or reduced-sodium tomato sauce and paste. Low-sodium or reduced-sodium canned vegetables.   Fruits  All fresh, canned (in natural juice), or frozen fruits.  Meat and Other Protein Products  Ground beef (85% or leaner), grass-fed beef, or beef trimmed of fat. Skinless chicken or turkey. Ground chicken or turkey. Pork trimmed of fat. All fish and seafood. Eggs. Dried beans, peas, or lentils. Unsalted nuts and seeds. Unsalted canned beans.  Dairy  Low-fat dairy products, such as skim or 1% milk, 2% or reduced-fat cheeses, low-fat ricotta or cottage cheese, or plain low-fat yogurt. Low-sodium or reduced-sodium cheeses.  Fats and Oils  Tub margarines without trans fats. Light or reduced-fat mayonnaise and salad dressings (reduced sodium). Avocado. Safflower, olive, or canola oils. Natural peanut or almond butter.  Other  Unsalted popcorn and pretzels.  The items listed above may not be a complete list of recommended foods or beverages. Contact your dietitian for more options.  WHAT FOODS ARE NOT RECOMMENDED?  Grains  White bread. White pasta. White rice. Refined cornbread. Bagels and croissants. Crackers that contain trans fat.  Vegetables  Creamed or fried vegetables. Vegetables in a cheese sauce. Regular canned vegetables. Regular canned tomato sauce and paste. Regular tomato and vegetable juices.  Fruits  Dried fruits. Canned fruit in light or heavy syrup. Fruit juice.  Meat and Other Protein   Products  Fatty cuts of meat. Ribs, chicken wings, bacon, sausage, bologna, salami, chitterlings, fatback, hot dogs, bratwurst, and packaged luncheon meats. Salted nuts and seeds. Canned beans with salt.  Dairy  Whole or 2% milk, cream, half-and-half, and cream cheese. Whole-fat or sweetened yogurt. Full-fat cheeses or blue cheese. Nondairy creamers and whipped toppings. Processed cheese, cheese spreads, or cheese  curds.  Condiments  Onion and garlic salt, seasoned salt, table salt, and sea salt. Canned and packaged gravies. Worcestershire sauce. Tartar sauce. Barbecue sauce. Teriyaki sauce. Soy sauce, including reduced sodium. Steak sauce. Fish sauce. Oyster sauce. Cocktail sauce. Horseradish. Ketchup and mustard. Meat flavorings and tenderizers. Bouillon cubes. Hot sauce. Tabasco sauce. Marinades. Taco seasonings. Relishes.  Fats and Oils  Butter, stick margarine, lard, shortening, ghee, and bacon fat. Coconut, palm kernel, or palm oils. Regular salad dressings.  Other  Pickles and olives. Salted popcorn and pretzels.  The items listed above may not be a complete list of foods and beverages to avoid. Contact your dietitian for more information.  WHERE CAN I FIND MORE INFORMATION?  National Heart, Lung, and Blood Institute: www.nhlbi.nih.gov/health/health-topics/topics/dash/     This information is not intended to replace advice given to you by your health care provider. Make sure you discuss any questions you have with your health care provider.     Document Released: 05/15/2011 Document Revised: 06/16/2014 Document Reviewed: 03/30/2013  Elsevier Interactive Patient Education ©2016 Elsevier Inc.

## 2015-07-10 NOTE — Progress Notes (Addendum)
Patient ID: Grace [name], female   DOB: 01/19/1961, 55 y.o.   MRN: [id number]     By signing my name below, I, Grace [name], attest that this documentation has been prepared under the direction and in the presence of Grace Queen, MD.  Electronically Signed: Zola [name], Medical Scribe. 07/10/2015. 9:26 AM.   Chief Complaint:  Chief Complaint  Patient presents with  . Annual Exam    HPI: Grace [name] is a 55 y.o. female with a history of goiter and hypertension who reports to [facility] today for a complete physical exam.  Patient notes that she did not take her blood pressure medications last night because she ate dinner late. She has not taken her medications this morning. She states she has otherwise been compliant with her medications.  Her last tetanus was in 2009. She declines flu shot today.  Patient is due for mammogram and colonoscopy. She is afraid of having a colonoscopy because her sister-in-law had a complication during her colonoscopy. She was scheduled for a mammogram this past August, but was unable to make her appointment. Her last mammogram was in 2012.  She has not had any difficulty swallowing with her goiter. Her last US neck was in 2013.  Patient has noticed some decreased hearing. She notes that she did listen to a lot of loud music when she was staying in [place] last year.  FMHx includes cardiomegaly (sister, congenital). Her PSHx includes tubal ligation and C-section.   Past Medical History  Diagnosis Date  . Hypertension    Past Surgical History  Procedure Laterality Date  . Tubal ligation     Social History   Social History  . Marital Status: Married    Spouse Name: N/A  . Number of Children: N/A  . Years of Education: N/A   Social History Main Topics  . Smoking status: Never Smoker   . Smokeless tobacco: None  . Alcohol Use: No  . Drug Use: No  . Sexual Activity: Yes   Other Topics Concern  . None   Social History Narrative   Family  History  Problem Relation Age of Onset  . Hypertension Mother    Allergies  Allergen Reactions  . Penicillins Other (See Comments)    Sisters are allergic   Prior to Admission medications   Medication Sig Start Date End Date Taking? Authorizing Provider  Iron 66 MG TABS Take 65 mg by mouth 2 (two) times daily.    Historical Provider, MD  lisinopril-hydrochlorothiazide (PRINZIDE,ZESTORETIC) 20-12.5 MG per tablet TAKE 1 TABLET BY MOUTH EVERY DAY. 01/29/15   Harrison Mons, PA-C  lisinopril-hydrochlorothiazide (ZESTORETIC) 20-12.5 MG per tablet Take 1 tablet by mouth daily. NEEDS OFFICE VISIT/LABS 05/19/12 05/19/13  Darreld Mclean, MD     ROS: The patient denies fevers, chills, night sweats, unintentional weight loss, chest pain, palpitations, wheezing, dyspnea on exertion, nausea, vomiting, abdominal pain, dysuria, hematuria, melena, numbness, weakness, or tingling.   All other systems have been reviewed and were otherwise negative with the exception of those mentioned in the HPI and as above.    PHYSICAL EXAM: Filed Vitals:   07/10/15 0912  BP: 169/94  Pulse: 92  Temp: 98.3 F (36.8 C)  Resp: 16   Body mass index is 29.07 kg/(m^2).   General: Alert, no acute distress HEENT:  Normocephalic, atraumatic, oropharynx patent. Hard of hearing. Neck: Large right and left lobe of the thyroid. Left lobe is almost baseball-sized. Eye: Juliette Mangle [facility] Cardiovascular:  Regular rate and rhythm,  no rubs murmurs or gallops.  No Carotid bruits, radial pulse intact. No pedal edema. Repeat blood pressure: 156/110 (left arm), 158/108 (right arm). Respiratory: Clear to auscultation bilaterally.  No wheezes, rales, or rhonchi.  No cyanosis, no use of accessory musculature Abdominal: No organomegaly, abdomen is soft and non-tender, positive bowel sounds.  No masses. Musculoskeletal: Gait intact. No edema, tenderness Skin: No rashes. Neurologic: Facial musculature symmetric. Psychiatric: Patient  acts appropriately throughout our interaction. Lymphatic: No cervical or submandibular lymphadenopathy Genitourinary: Breasts normal.     LABS:    EKG/XRAY:   Primary read interpreted by Dr. Everlene Farrier at [facility]. Patient has voltage criteria for LVH with left axis deviation.   ASSESSMENT/PLAN: Blood pressure not at goal. Medications were refilled. I added amlodipine 5 mg daily to help with blood pressure. Referral made for screening mammogram. Referral made for audiology evaluation. Referral made to general surgery to evaluate her enlarging goiter. I have also scheduled an ultrasound of her neck to compare to previous ultrasound of 2013. She has had a biopsy of the thyroid before showing a goiter.I personally performed the services described in this documentation, which was scribed in my presence. The recorded information has been reviewed and is accurate.    Gross sideeffects, risk and benefits, and alternatives of medications d/w patient. Patient is aware that all medications have potential sideeffects and we are unable to predict every sideeffect or drug-drug interaction that may occur.  Grace Queen MD 07/10/2015 9:26 AM

## 2015-07-11 ENCOUNTER — Telehealth: Payer: Self-pay | Admitting: Family Medicine

## 2015-07-11 NOTE — Telephone Encounter (Signed)
lmom that paper work and labs was ready to be picked up at 104

## 2015-07-12 ENCOUNTER — Ambulatory Visit
Admission: RE | Admit: 2015-07-12 | Discharge: 2015-07-12 | Disposition: A | Discharge status: Home / Self Care | Payer: Commercial Managed Care - PPO | Source: Ambulatory Visit | Attending: Emergency Medicine | Admitting: Emergency Medicine

## 2015-07-12 DIAGNOSIS — E049 Nontoxic goiter, unspecified: Secondary | ICD-10-CM

## 2015-08-10 ENCOUNTER — Other Ambulatory Visit: Payer: Self-pay | Admitting: General Surgery

## 2015-08-23 ENCOUNTER — Ambulatory Visit: Payer: Commercial Managed Care - PPO | Admitting: Emergency Medicine

## 2015-10-25 ENCOUNTER — Ambulatory Visit (INDEPENDENT_AMBULATORY_CARE_PROVIDER_SITE_OTHER): Payer: Commercial Managed Care - PPO | Admitting: Emergency Medicine

## 2015-10-25 ENCOUNTER — Encounter: Payer: Self-pay | Admitting: Emergency Medicine

## 2015-10-25 VITALS — BP 134/96 | HR 77 | Temp 98.5°F | Resp 16 | Ht 65.5 in | Wt 173.4 lb

## 2015-10-25 DIAGNOSIS — I10 Essential (primary) hypertension: Secondary | ICD-10-CM

## 2015-10-25 DIAGNOSIS — E049 Nontoxic goiter, unspecified: Secondary | ICD-10-CM | POA: Diagnosis not present

## 2015-10-25 MED ORDER — METOPROLOL SUCCINATE ER 25 MG PO TB24: 25.0000 mg | ORAL_TABLET | Freq: Every day | ORAL | Status: DC

## 2015-10-25 NOTE — Progress Notes (Signed)
By signing my name below, I, Mesha Guinyard, attest that this documentation has been prepared under the direction and in the presence of Arlyss Queen, MD.  Electronically Signed: Verlee [name], Medical Scribe. 10/25/2015. 4:15 PM.  Chief Complaint:  Chief Complaint  Patient presents with  . Follow-up  . Hypertension  . Amlodipine Besylate 5 mg    per pt "causes legs to swell, tight and in pain, stopped using after 1 1/2 wks  . Referral    pt would like to be referred to a different doctor for Goiter    HPI: Grace [name] is a 55 y.o. female with a PMHx of HTN, who reports to [facility] today for a follow-up. Pt states she's doing well overall. She stopped taking amlodipine because it because it made her legs feel tight after taking it for a week and a half. Pt wants to get another bp medication with the least amount of side effects. Pt reports wanting to get a referral for her goiter. She states it doesn't hurt, an doesn't remember when it begin to form. Pt states wanting to get her goiter fixed before she gets her colonoscopy.Pt reports walking for exercise. Pt reports she's getting a hearing aid soon. Pt mentions she wants to go to another doctor that knows her. She got her mammogram done this year.   Past Medical History  Diagnosis Date  . Hypertension    Past Surgical History  Procedure Laterality Date  . Tubal ligation     Social History   Social History  . Marital Status: Married    Spouse Name: N/A  . Number of Children: N/A  . Years of Education: N/A   Social History Main Topics  . Smoking status: Never Smoker   . Smokeless tobacco: None  . Alcohol Use: No  . Drug Use: No  . Sexual Activity: Yes   Other Topics Concern  . None   Social History Narrative   Family History  Problem Relation Age of Onset  . Hypertension Mother    Allergies  Allergen Reactions  . Penicillins Other (See Comments)    Sisters are allergic   Prior to Admission medications     Medication Sig Start Date End Date Taking? Authorizing Provider  lisinopril-hydrochlorothiazide (PRINZIDE,ZESTORETIC) 20-12.5 MG tablet TAKE 1 TABLET BY MOUTH EVERY DAY. 07/10/15  Yes Darlyne Russian, MD  amLODipine (NORVASC) 5 MG tablet Take 1 tablet (5 mg total) by mouth daily. Patient not taking: Reported on 10/25/2015 07/10/15   Darlyne Russian, MD     ROS: The patient denies fevers, chills, night sweats, unintentional weight loss, chest pain, palpitations, wheezing, dyspnea on exertion, nausea, vomiting, abdominal pain, dysuria, hematuria, melena, numbness, weakness, or tingling.   All other systems have been reviewed and were otherwise negative with the exception of those mentioned in the HPI and as above.    PHYSICAL EXAM: Filed Vitals:   10/25/15 1559 10/25/15 1604  BP: 136/100 134/96  Pulse: 77   Temp: 98.5 F (36.9 C)   Resp: 16    Body mass index is 28.41 kg/(m^2).  BP reading on left arm: 140/100  General: Alert, no acute distress HEENT:  Normocephalic, atraumatic, oropharynx patent. She has a huge goiter in her neck. This is not fixed but firm to touch. Eye: Juliette Mangle [facility] Cardiovascular:  Regular rate and rhythm, no rubs murmurs or gallops.  No Carotid bruits, radial pulse intact. No pedal edema.  Respiratory: Clear to auscultation bilaterally.  No wheezes, rales,  or rhonchi.  No cyanosis, no use of accessory musculature Abdominal: No organomegaly, abdomen is soft and non-tender, positive bowel sounds.  No masses. Musculoskeletal: Gait intact. No edema, tenderness Skin: No rashes. Neurologic: Facial musculature symmetric. Psychiatric: Patient acts appropriately throughout our interaction. Lymphatic: No cervical or submandibular lymphadenopathy   LABS:    EKG/XRAY:   Primary read interpreted by Dr. Everlene Farrier at [facility].   ASSESSMENT/PLAN: Referral will be made to Dr. Kaylyn Lim or Dr. Georgette Dover to see about evaluating her to have her thyroid removed. I started her on  Toprol-XL 25 one a day to see if we can get better control of her pressure. She will stop by a few weeks to have a repeat repeat blood pressure taken. She said the amlodipine caused her legs to swell.I personally performed the services described in this documentation, which was scribed in my presence. The recorded information has been reviewed and is accurate.   Gross sideeffects, risk and benefits, and alternatives of medications d/w patient. Patient is aware that all medications have potential sideeffects and we are unable to predict every sideeffect or drug-drug interaction that may occur.  Arlyss Queen MD 10/25/2015 4:05 PM

## 2015-10-25 NOTE — Patient Instructions (Addendum)
I will make you another appointment to see a different surgeon regarding her goiter. I have added Toprol-XL 25 to take 1 a day. Please stop by in the next few weeks for a blood pressure check.    IF you received an x-ray today, you will receive an invoice from [facility]. Please contact [facility] at [phone] with questions or concerns regarding your invoice.   IF you received labwork today, you will receive an invoice from [employer]. Please contact [facility] at [phone] with questions or concerns regarding your invoice.   Our billing staff will not be able to assist you with questions regarding bills from these companies.  You will be contacted with the lab results as soon as they are available. The fastest way to get your results is to activate your My Chart account. Instructions are located on the last page of this paperwork. If you have not heard from [place] regarding the results in 2 weeks, please contact this office.

## 2016-02-08 ENCOUNTER — Ambulatory Visit: Payer: Self-pay | Admitting: Surgery

## 2016-02-08 NOTE — H&P (Signed)
Grace [name] 02/08/2016 9:38 AM Location: [facility] Patient #: [id number] DOB: 09/06/1960 Married / Language: English / Race: Black or African American Female  History of Present Illness Marcello Moores A. Elfreida Heggs MD; 02/08/2016 9:59 AM) Patient words: thyroid              he patient is a 55 year old female who presents with a complaint of Enlarged thyroid. Patient is a generally healthy 55 year old female read for by Dr. Everlene Farrier for progressive goiter. The patient states that she first noted some enlargement particularly in the left side of her neck between 6 months to a year ago. Since that time she has noticed progressive enlargement. She remarkably has not had any particular symptoms that she can pinpoint. Denies dysphagia. Denies stridor or breathing problems. She maybe has some pressure feeling but no real discomfort. She definitely however has had progressive enlargement.  She was seen in march of 2017 and declined surgery.  She desires to proceed with total thyroidectomy she is not having any progressive symptoms of globus, SOB or dysphonia or dysphagia.  The patient is a 55 year old female.   Problem List/Past Medical Ventura Sellers, [place]; 02/08/2016 9:38 AM) GOITER DIFFUSE (E04.9)  Other Problems Ventura Sellers, CMA; 02/08/2016 9:38 AM) High blood pressure Thyroid Disease  Past Surgical History Ventura Sellers, [place]; 02/08/2016 9:38 AM) Cesarean Section - 1  Diagnostic Studies History Ventura Sellers, [place]; 02/08/2016 9:38 AM) Colonoscopy never Mammogram within last year Pap Smear 1-5 years ago  Allergies Ventura Sellers, CMA; 02/08/2016 9:39 AM) Penicillin V Potassium *PENICILLINS* AmLODIPine Besylate *CALCIUM CHANNEL BLOCKERS*  Medication History Ventura Sellers, CMA; 02/08/2016 9:39 AM) Lisinopril-Hydrochlorothiazide (20-12.5MG  Tablet, Oral) Active. Medications Reconciled  Social History Ventura Sellers, [place]; 02/08/2016 9:39  AM) Caffeine use Tea. No alcohol use No drug use Tobacco use Never smoker.  Family History Ventura Sellers, [place]; 02/08/2016 9:39 AM) Diabetes Mellitus Sister. Heart Disease Sister. Heart disease in female family member before age 43 Hypertension Mother, Sister.  Pregnancy / Birth History Ventura Sellers, [place]; 02/08/2016 9:39 AM) Age at menarche 20 years. Age of menopause 34-50 Gravida 2 Maternal age 44-20 Para 2    Vitals Sharyn Lull R. Brooks CMA; 02/08/2016 9:38 AM) 02/08/2016 9:38 AM Weight: 176.38 lb Height: 65.5in Body Surface Area: 1.89 m Body Mass Index: 28.9 kg/m  BP: 140/86 (Sitting, Left Arm, Standard)      Physical Exam (Ronette Hank A. Shanyiah Conde MD; 02/08/2016 10:01 AM)  General Mental Status-Alert. General Appearance-Consistent with stated age. Hydration-Well hydrated. Voice-Normal.  Head and Neck Note: Massively enlarged thyroid gland. Significant disfigurement noted. Voice is normal. Swallowing is normal. No cervical adenopathy.  Eye Eyeball - Bilateral-Extraocular movements intact. Sclera/Conjunctiva - Bilateral-No scleral icterus.  Chest and Lung Exam Chest and lung exam reveals -quiet, even and easy respiratory effort with no use of accessory muscles and on auscultation, normal breath sounds, no adventitious sounds and normal vocal resonance. Inspection Chest Wall - Normal. Back - normal.  Cardiovascular Cardiovascular examination reveals -normal heart sounds, regular rate and rhythm with no murmurs and normal pedal pulses bilaterally.  Neurologic Neurologic evaluation reveals -alert and oriented x 3 with no impairment of recent or remote memory. Mental Status-Normal.  Lymphatic Head & Neck  General Head & Neck Lymphatics: Bilateral - Description - Normal.    Assessment & Plan (Jeren Dufrane A. Manie Bealer MD; 02/08/2016 9:57 AM)  GOITER DIFFUSE (E04.9) Impression: Very large progressive goiter. Low risk for  malignancy. It is surprising  that she is not having more symptoms than she is. I think she is at risk for potential airway compromise or swallowing difficulties and this continues to grow on follow-up ultrasound. I've recommended total thyroidectomy. I discussed alternative for thyroid suppression however I think she is unlikely to get enough improvement to avoid surgery. I discussed total thyroidectomy in detail including the indications and nature of the surgery and expected recovery as well as potential risks of hypoparathyroidism with permanent hypocalcemia, recurrent laryngeal nerve injury with potential permanent hoarseness as well as risks of anesthesia, bleeding, infection or medication reactions. She was given literature regarding the procedure. All her questions were answered and she would like to proceed.  Current Plans The anatomy and physiology of the thyroid gland and organs of the neck were discussed. Pathophysiology of thyroid problems were discussed. Options were discussed, and I made a recommendation to remove part (and possibly all) of the thyroid gland to treat the pathology.  Risks of bleeding, infection, injury to other organs including nerves, reoperation, death, and other risks were discussed. I noted a good likelihood this will help address the problem. While there are risks, I feel the risks of nonoperative management are greater; therefore, I feel surgery offers the best option. Educational material was given to help further explain the topics & concerns from our discussion. We will work to minimize complications.  You are being scheduled for surgery - Our schedulers will call you.  You should hear from our office's scheduling department within 5 working days about the location, date, and time of surgery. We try to make accommodations for patient's preferences in scheduling surgery, but sometimes the OR schedule or the surgeon's schedule prevents [place] from making those  accommodations.  If you have not heard from our office [phone]) in 5 working days, call the office and ask for your surgeon's nurse.  If you have other questions about your diagnosis, plan, or surgery, call the office and ask for your surgeon's nurse.  Pt Education - Pamphlet Given - The Thyroid Book: discussed with patient and provided information. Pt Education - [facility] Thyroid/ Parathyroid HCI Schedule for Surgery

## 2016-07-25 ENCOUNTER — Other Ambulatory Visit: Payer: Self-pay | Admitting: Emergency Medicine

## 2016-07-25 DIAGNOSIS — I10 Essential (primary) hypertension: Secondary | ICD-10-CM

## 2016-10-29 ENCOUNTER — Ambulatory Visit: Payer: Commercial Managed Care - PPO | Admitting: Family Medicine

## 2016-10-30 ENCOUNTER — Encounter: Payer: Self-pay | Admitting: Family Medicine

## 2016-10-30 ENCOUNTER — Encounter: Payer: Self-pay | Admitting: Emergency Medicine

## 2016-10-30 ENCOUNTER — Ambulatory Visit (INDEPENDENT_AMBULATORY_CARE_PROVIDER_SITE_OTHER): Payer: Commercial Managed Care - PPO | Admitting: Emergency Medicine

## 2016-10-30 VITALS — BP 140/90 | HR 89 | Temp 99.0°F | Resp 18 | Ht 65.75 in | Wt 179.4 lb

## 2016-10-30 DIAGNOSIS — E049 Nontoxic goiter, unspecified: Secondary | ICD-10-CM

## 2016-10-30 DIAGNOSIS — I10 Essential (primary) hypertension: Secondary | ICD-10-CM

## 2016-10-30 DIAGNOSIS — Z0001 Encounter for general adult medical examination with abnormal findings: Secondary | ICD-10-CM | POA: Diagnosis not present

## 2016-10-30 DIAGNOSIS — Z1231 Encounter for screening mammogram for malignant neoplasm of breast: Secondary | ICD-10-CM | POA: Diagnosis not present

## 2016-10-30 NOTE — Assessment & Plan Note (Signed)
Still present; left side larger; needs ENT evaluation; has seen ENT in the past but afraid of surgery.

## 2016-10-30 NOTE — Patient Instructions (Addendum)
   IF you received an x-ray today, you will receive an invoice from [facility]. Please contact [facility] at [phone] with questions or concerns regarding your invoice.   IF you received labwork today, you will receive an invoice from [facility]. Please contact [facility] at [phone] with questions or concerns regarding your invoice.   Our billing staff will not be able to assist you with questions regarding bills from these companies.  You will be contacted with the lab results as soon as they are available. The fastest way to get your results is to activate your My Chart account. Instructions are located on the last page of this paperwork. If you have not heard from us regarding the results in 2 weeks, please contact this office.      Health Maintenance, Female Adopting a healthy lifestyle and getting preventive care can go a long way to promote health and wellness. Talk with your health care provider about what schedule of regular examinations is right for you. This is a good chance for you to check in with your provider about disease prevention and staying healthy. In between checkups, there are plenty of things you can do on your own. Experts have done a lot of research about which lifestyle changes and preventive measures are most likely to keep you healthy. Ask your health care provider for more information. Weight and diet Eat a healthy diet  Be sure to include plenty of vegetables, fruits, low-fat dairy products, and lean protein.  Do not eat a lot of foods high in solid fats, added sugars, or salt.  Get regular exercise. This is one of the most important things you can do for your health.  Most adults should exercise for at least 150 minutes each week. The exercise should increase your heart rate and make you sweat (moderate-intensity exercise).  Most adults should also do strengthening exercises at least twice a week. This is in addition to the  moderate-intensity exercise. Maintain a healthy weight  Body mass index (BMI) is a measurement that can be used to identify possible weight problems. It estimates body fat based on height and weight. Your health care provider can help determine your BMI and help you achieve or maintain a healthy weight.  For females 20 years of age and older:  A BMI below 18.5 is considered underweight.  A BMI of 18.5 to 24.9 is normal.  A BMI of 25 to 29.9 is considered overweight.  A BMI of 30 and above is considered obese. Watch levels of cholesterol and blood lipids  You should start having your blood tested for lipids and cholesterol at 57 years of age, then have this test every 5 years.  You may need to have your cholesterol levels checked more often if:  Your lipid or cholesterol levels are high.  You are older than 57 years of age.  You are at high risk for heart disease. Cancer screening Lung Cancer  Lung cancer screening is recommended for adults 55-80 years old who are at high risk for lung cancer because of a history of smoking.  A yearly low-dose CT scan of the lungs is recommended for people who:  Currently smoke.  Have quit within the past 15 years.  Have at least a 30-pack-year history of smoking. A pack year is smoking an average of one pack of cigarettes a day for 1 year.  Yearly screening should continue until it has been 15 years since you quit.  Yearly screening should   stop if you develop a health problem that would prevent you from having lung cancer treatment. Breast Cancer  Practice breast self-awareness. This means understanding how your breasts normally appear and feel.  It also means doing regular breast self-exams. Let your health care provider know about any changes, no matter how small.  If you are in your 20s or 30s, you should have a clinical breast exam (CBE) by a health care provider every 1-3 years as part of a regular health exam.  If you are 40 or  older, have a CBE every year. Also consider having a breast X-ray (mammogram) every year.  If you have a family history of breast cancer, talk to your health care provider about genetic screening.  If you are at high risk for breast cancer, talk to your health care provider about having an MRI and a mammogram every year.  Breast cancer gene (BRCA) assessment is recommended for women who have family members with BRCA-related cancers. BRCA-related cancers include:  Breast.  Ovarian.  Tubal.  Peritoneal cancers.  Results of the assessment will determine the need for genetic counseling and BRCA1 and BRCA2 testing. Cervical Cancer  Your health care provider may recommend that you be screened regularly for cancer of the pelvic organs (ovaries, uterus, and vagina). This screening involves a pelvic examination, including checking for microscopic changes to the surface of your cervix (Pap test). You may be encouraged to have this screening done every 3 years, beginning at age 21.  For women ages 30-65, health care providers may recommend pelvic exams and Pap testing every 3 years, or they may recommend the Pap and pelvic exam, combined with testing for human papilloma virus (HPV), every 5 years. Some types of HPV increase your risk of cervical cancer. Testing for HPV may also be done on women of any age with unclear Pap test results.  Other health care providers may not recommend any screening for nonpregnant women who are considered low risk for pelvic cancer and who do not have symptoms. Ask your health care provider if a screening pelvic exam is right for you.  If you have had past treatment for cervical cancer or a condition that could lead to cancer, you need Pap tests and screening for cancer for at least 20 years after your treatment. If Pap tests have been discontinued, your risk factors (such as having a new sexual partner) need to be reassessed to determine if screening should resume. Some  women have medical problems that increase the chance of getting cervical cancer. In these cases, your health care provider may recommend more frequent screening and Pap tests. Colorectal Cancer  This type of cancer can be detected and often prevented.  Routine colorectal cancer screening usually begins at 57 years of age and continues through 57 years of age.  Your health care provider may recommend screening at an earlier age if you have risk factors for colon cancer.  Your health care provider may also recommend using home test kits to check for hidden blood in the stool.  A small camera at the end of a tube can be used to examine your colon directly (sigmoidoscopy or colonoscopy). This is done to check for the earliest forms of colorectal cancer.  Routine screening usually begins at age 50.  Direct examination of the colon should be repeated every 5-10 years through 57 years of age. However, you may need to be screened more often if early forms of precancerous polyps or small growths are   found. Skin Cancer  Check your skin from head to toe regularly.  Tell your health care provider about any new moles or changes in moles, especially if there is a change in a mole's shape or color.  Also tell your health care provider if you have a mole that is larger than the size of a pencil eraser.  Always use sunscreen. Apply sunscreen liberally and repeatedly throughout the day.  Protect yourself by wearing long sleeves, pants, a wide-brimmed hat, and sunglasses whenever you are outside. Heart disease, diabetes, and high blood pressure  High blood pressure causes heart disease and increases the risk of stroke. High blood pressure is more likely to develop in:  People who have blood pressure in the high end of the normal range (130-139/85-89 mm Hg).  People who are overweight or obese.  People who are African American.  If you are 18-39 years of age, have your blood pressure checked every  3-5 years. If you are 40 years of age or older, have your blood pressure checked every year. You should have your blood pressure measured twice-once when you are at a hospital or clinic, and once when you are not at a hospital or clinic. Record the average of the two measurements. To check your blood pressure when you are not at a hospital or clinic, you can use:  An automated blood pressure machine at a pharmacy.  A home blood pressure monitor.  If you are between 55 years and 79 years old, ask your health care provider if you should take aspirin to prevent strokes.  Have regular diabetes screenings. This involves taking a blood sample to check your fasting blood sugar level.  If you are at a normal weight and have a low risk for diabetes, have this test once every three years after 57 years of age.  If you are overweight and have a high risk for diabetes, consider being tested at a younger age or more often. Preventing infection Hepatitis B  If you have a higher risk for hepatitis B, you should be screened for this virus. You are considered at high risk for hepatitis B if:  You were born in a country where hepatitis B is common. Ask your health care provider which countries are considered high risk.  Your parents were born in a high-risk country, and you have not been immunized against hepatitis B (hepatitis B vaccine).  You have HIV or AIDS.  You use needles to inject street drugs.  You live with someone who has hepatitis B.  You have had sex with someone who has hepatitis B.  You get hemodialysis treatment.  You take certain medicines for conditions, including cancer, organ transplantation, and autoimmune conditions. Hepatitis C  Blood testing is recommended for:  Everyone born from 1945 through 1965.  Anyone with known risk factors for hepatitis C. Sexually transmitted infections (STIs)  You should be screened for sexually transmitted infections (STIs) including  gonorrhea and chlamydia if:  You are sexually active and are younger than 57 years of age.  You are older than 57 years of age and your health care provider tells you that you are at risk for this type of infection.  Your sexual activity has changed since you were last screened and you are at an increased risk for chlamydia or gonorrhea. Ask your health care provider if you are at risk.  If you do not have HIV, but are at risk, it may be recommended that you take a   prescription medicine daily to prevent HIV infection. This is called pre-exposure prophylaxis (PrEP). You are considered at risk if:  You are sexually active and do not regularly use condoms or know the HIV status of your partner(s).  You take drugs by injection.  You are sexually active with a partner who has HIV. Talk with your health care provider about whether you are at high risk of being infected with HIV. If you choose to begin PrEP, you should first be tested for HIV. You should then be tested every 3 months for as long as you are taking PrEP. Pregnancy  If you are premenopausal and you may become pregnant, ask your health care provider about preconception counseling.  If you may become pregnant, take 400 to 800 micrograms (mcg) of folic acid every day.  If you want to prevent pregnancy, talk to your health care provider about birth control (contraception). Osteoporosis and menopause  Osteoporosis is a disease in which the bones lose minerals and strength with aging. This can result in serious bone fractures. Your risk for osteoporosis can be identified using a bone density scan.  If you are 65 years of age or older, or if you are at risk for osteoporosis and fractures, ask your health care provider if you should be screened.  Ask your health care provider whether you should take a calcium or vitamin D supplement to lower your risk for osteoporosis.  Menopause may have certain physical symptoms and risks.  Hormone  replacement therapy may reduce some of these symptoms and risks. Talk to your health care provider about whether hormone replacement therapy is right for you. Follow these instructions at home:  Schedule regular health, dental, and eye exams.  Stay current with your immunizations.  Do not use any tobacco products including cigarettes, chewing tobacco, or electronic cigarettes.  If you are pregnant, do not drink alcohol.  If you are breastfeeding, limit how much and how often you drink alcohol.  Limit alcohol intake to no more than 1 drink per day for nonpregnant women. One drink equals 12 ounces of beer, 5 ounces of wine, or 1 ounces of hard liquor.  Do not use street drugs.  Do not share needles.  Ask your health care provider for help if you need support or information about quitting drugs.  Tell your health care provider if you often feel depressed.  Tell your health care provider if you have ever been abused or do not feel safe at home. This information is not intended to replace advice given to you by your health care provider. Make sure you discuss any questions you have with your health care provider. Document Released: 12/09/2010 Document Revised: 11/01/2015 Document Reviewed: 02/27/2015 Elsevier Interactive Patient Education  2017 [employer]  American Heart Association (AHA) Exercise Recommendation  Being physically active is important to prevent heart disease and stroke, the nation's No. 1and No. 5killers. To improve overall cardiovascular health, we suggest at least 150 minutes per week of moderate exercise or 75 minutes per week of vigorous exercise (or a combination of moderate and vigorous activity). Thirty minutes a day, five times a week is an easy goal to remember. You will also experience benefits even if you divide your time into two or three segments of 10 to 15 minutes per day.  For people who would benefit from lowering their blood pressure or cholesterol,  we recommend 40 minutes of aerobic exercise of moderate to vigorous intensity three to four times a week to   lower the risk for heart attack and stroke.  Physical activity is anything that makes you move your body and burn calories.  This includes things like climbing stairs or playing sports. Aerobic exercises benefit your heart, and include walking, jogging, swimming or biking. Strength and stretching exercises are best for overall stamina and flexibility.  The simplest, positive change you can make to effectively improve your heart health is to start walking. It's enjoyable, free, easy, social and great exercise. A walking program is flexible and boasts high success rates because people can stick with it. It's easy for walking to become a regular and satisfying part of life.   For Overall Cardiovascular Health:  At least 30 minutes of moderate-intensity aerobic activity at least 5 days per week for a total of 150  OR   At least 25 minutes of vigorous aerobic activity at least 3 days per week for a total of 75 minutes; or a combination of moderate- and vigorous-intensity aerobic activity  AND   Moderate- to high-intensity muscle-strengthening activity at least 2 days per week for additional health benefits.  For Lowering Blood Pressure and Cholesterol  An average 40 minutes of moderate- to vigorous-intensity aerobic activity 3 or 4 times per week  What if I can't make it to the time goal? Something is always better than nothing! And everyone has to start somewhere. Even if you've been sedentary for years, today is the day you can begin to make healthy changes in your life. If you don't think you'll make it for 30 or 40 minutes, set a reachable goal for today. You can work up toward your overall goal by increasing your time as you get stronger. Don't let all-or-nothing thinking rob you of doing what you can every day.  Source:http://www.heart.org    

## 2016-10-30 NOTE — Assessment & Plan Note (Signed)
Did not take meds yesterday or today. Numbers out of goal

## 2016-10-30 NOTE — Progress Notes (Signed)
Grace [name] 56 y.o.   Chief Complaint  Patient presents with  . Annual Exam    needs bloodwork for work     HISTORY OF PRESENT ILLNESS: This is a 56 y.o. female here for annual exam.  Current Assessment & Plan Note Edited: Horald Pollen, MD Today Hypertension: Did not take meds yesterday or today. Numbers out of goal         Current Assessment & Plan Note Edited: Horald Pollen, MD Today: Goiter Still present; left side larger; needs ENT evaluation; has seen ENT in the past but afraid of surgery.       Denies trouble breathing or swallowing. Scheduled for mammogram today. No colonoscopy done yet.   HPI   Prior to Admission medications   Medication Sig Start Date End Date Taking? Authorizing Provider  lisinopril-hydrochlorothiazide (PRINZIDE,ZESTORETIC) 20-12.5 MG tablet TAKE 1 TABLET BY MOUTH EVERY DAY. 07/25/16  Yes Tereasa Coop, PA-C  amLODipine (NORVASC) 5 MG tablet Take 1 tablet (5 mg total) by mouth daily. Patient not taking: Reported on 10/25/2015 07/10/15   Darlyne Russian, MD  metoprolol succinate (TOPROL-XL) 25 MG 24 hr tablet Take 1 tablet (25 mg total) by mouth daily. Patient not taking: Reported on 10/30/2016 10/25/15   Darlyne Russian, MD    Allergies  Allergen Reactions  . Penicillins Other (See Comments)    Sisters are allergic  . Amlodipine     swelling    Patient Active Problem List   Diagnosis Date Noted  . Hyperlipidemia 07/10/2015  . Hearing loss 07/10/2015  . Hypertension 12/23/2011  . Goiter 12/23/2011  . Fibroids 12/23/2011    Past Medical History:  Diagnosis Date  . Hypertension     Past Surgical History:  Procedure Laterality Date  . CESAREAN SECTION    . TUBAL LIGATION      Social History   Social History  . Marital status: Married    Spouse name: N/A  . Number of children: N/A  . Years of education: N/A   Occupational History  . Not on file.   Social History Main Topics  . Smoking status:  Never Smoker  . Smokeless tobacco: Never Used  . Alcohol use No  . Drug use: No  . Sexual activity: Yes   Other Topics Concern  . Not on file   Social History Narrative  . No narrative on file    Family History  Problem Relation Age of Onset  . Hypertension Mother      Review of Systems  Constitutional: Negative.  Negative for chills, fever, malaise/fatigue and weight loss.  HENT: Negative.  Negative for congestion, ear discharge, ear pain, nosebleeds and sore throat.   Eyes: Negative.   Respiratory: Negative.  Negative for cough, hemoptysis, shortness of breath and stridor.   Cardiovascular: Negative.  Negative for chest pain, palpitations and leg swelling.  Gastrointestinal: Negative.  Negative for abdominal pain, diarrhea, nausea and vomiting.  Genitourinary: Negative.  Negative for dysuria and hematuria.  Musculoskeletal: Negative.  Negative for back pain, myalgias and neck pain.  Skin: Negative.  Negative for rash.  Neurological: Negative.  Negative for dizziness, sensory change, speech change, focal weakness, seizures and headaches.  Endo/Heme/Allergies: Negative.   All other systems reviewed and are negative.  Vitals:   10/30/16 0817 10/30/16 0906  BP: (!) 162/106 140/90  Pulse: 89   Resp: 18   Temp: 99 F (37.2 C)      Physical Exam  Constitutional: She is oriented  to person, place, and time. She appears well-developed and well-nourished.  HENT:  Head: Normocephalic and atraumatic.  Nose: Nose normal.  Mouth/Throat: Oropharynx is clear and moist. No oropharyngeal exudate.  Eyes: Conjunctivae and EOM are normal. Pupils are equal, round, and reactive to light.  Neck: Normal range of motion. Neck supple. No JVD present. Thyromegaly present.  Large mass to left side  Cardiovascular: Normal rate, regular rhythm, normal heart sounds and intact distal pulses.   Pulmonary/Chest: Effort normal and breath sounds normal.  Abdominal: Soft. Bowel sounds are normal.  She exhibits no distension and no mass. There is no tenderness.  Musculoskeletal: Normal range of motion. She exhibits no edema or tenderness.  Neurological: She is alert and oriented to person, place, and time. She displays normal reflexes. No sensory deficit. She exhibits normal muscle tone. Coordination normal.  Skin: Skin is warm and dry. Capillary refill takes less than 2 seconds. No rash noted.  Psychiatric: She has a normal mood and affect. Her behavior is normal.  Vitals reviewed.    ASSESSMENT & PLAN: Grace [name] was seen today for annual exam.  Diagnoses and all orders for this visit:  Encounter for general adult medical examination with abnormal findings -     CBC with Differential -     Comprehensive metabolic panel -     Hemoglobin A1c -     Lipid panel -     TSH -     HIV antibody -     Ambulatory referral to ENT -     Ambulatory referral to Gastroenterology  Essential hypertension -     CBC with Differential -     Comprehensive metabolic panel -     Hemoglobin A1c -     Lipid panel -     TSH -     HIV antibody -     Ambulatory referral to ENT -     Ambulatory referral to Gastroenterology  Goiter -     CBC with Differential -     Comprehensive metabolic panel -     Hemoglobin A1c -     Lipid panel -     TSH -     HIV antibody -     Ambulatory referral to ENT -     Ambulatory referral to Gastroenterology   Patient Instructions       IF you received an x-ray today, you will receive an invoice from [facility]. Please contact [facility] at [phone] with questions or concerns regarding your invoice.   IF you received labwork today, you will receive an invoice from [place]. Please contact [facility] at [phone] with questions or concerns regarding your invoice.   Our billing staff will not be able to assist you with questions regarding bills from these companies.  You will be contacted with the lab results as soon as they are  available. The fastest way to get your results is to activate your My Chart account. Instructions are located on the last page of this paperwork. If you have not heard from [place] regarding the results in 2 weeks, please contact this office.       Health Maintenance, Female Adopting a healthy lifestyle and getting preventive care can go a long way to promote health and wellness. Talk with your health care provider about what schedule of regular examinations is right for you. This is a good chance for you to check in with your provider about disease prevention and staying healthy. In between checkups,  there are plenty of things you can do on your own. Experts have done a lot of research about which lifestyle changes and preventive measures are most likely to keep you healthy. Ask your health care provider for more information. Weight and diet Eat a healthy diet  Be sure to include plenty of vegetables, fruits, low-fat dairy products, and lean protein.  Do not eat a lot of foods high in solid fats, added sugars, or salt.  Get regular exercise. This is one of the most important things you can do for your health.  Most adults should exercise for at least 150 minutes each week. The exercise should increase your heart rate and make you sweat (moderate-intensity exercise).  Most adults should also do strengthening exercises at least twice a week. This is in addition to the moderate-intensity exercise. Maintain a healthy weight  Body mass index (BMI) is a measurement that can be used to identify possible weight problems. It estimates body fat based on height and weight. Your health care provider can help determine your BMI and help you achieve or maintain a healthy weight.  For females 62 years of age and older:  A BMI below 18.5 is considered underweight.  A BMI of 18.5 to 24.9 is normal.  A BMI of 25 to 29.9 is considered overweight.  A BMI of 30 and above is considered obese. Watch levels of  cholesterol and blood lipids  You should start having your blood tested for lipids and cholesterol at 57 years of age, then have this test every 5 years.  You may need to have your cholesterol levels checked more often if:  Your lipid or cholesterol levels are high.  You are older than 57 years of age.  You are at high risk for heart disease. Cancer screening Lung Cancer  Lung cancer screening is recommended for adults 44-64 years old who are at high risk for lung cancer because of a history of smoking.  A yearly low-dose CT scan of the lungs is recommended for people who:  Currently smoke.  Have quit within the past 15 years.  Have at least a 30-pack-year history of smoking. A pack year is smoking an average of one pack of cigarettes a day for 1 year.  Yearly screening should continue until it has been 15 years since you quit.  Yearly screening should stop if you develop a health problem that would prevent you from having lung cancer treatment. Breast Cancer  Practice breast self-awareness. This means understanding how your breasts normally appear and feel.  It also means doing regular breast self-exams. Let your health care provider know about any changes, no matter how small.  If you are in your 20s or 30s, you should have a clinical breast exam (CBE) by a health care provider every 1-3 years as part of a regular health exam.  If you are 10 or older, have a CBE every year. Also consider having a breast X-ray (mammogram) every year.  If you have a family history of breast cancer, talk to your health care provider about genetic screening.  If you are at high risk for breast cancer, talk to your health care provider about having an MRI and a mammogram every year.  Breast cancer gene (BRCA) assessment is recommended for women who have family members with BRCA-related cancers. BRCA-related cancers include:  Breast.  Ovarian.  Tubal.  Peritoneal cancers.  Results of  the assessment will determine the need for genetic counseling and BRCA1 and  BRCA2 testing. Cervical Cancer  Your health care provider may recommend that you be screened regularly for cancer of the pelvic organs (ovaries, uterus, and vagina). This screening involves a pelvic examination, including checking for microscopic changes to the surface of your cervix (Pap test). You may be encouraged to have this screening done every 3 years, beginning at age 25.  For women ages 77-65, health care providers may recommend pelvic exams and Pap testing every 3 years, or they may recommend the Pap and pelvic exam, combined with testing for human papilloma virus (HPV), every 5 years. Some types of HPV increase your risk of cervical cancer. Testing for HPV may also be done on women of any age with unclear Pap test results.  Other health care providers may not recommend any screening for nonpregnant women who are considered low risk for pelvic cancer and who do not have symptoms. Ask your health care provider if a screening pelvic exam is right for you.  If you have had past treatment for cervical cancer or a condition that could lead to cancer, you need Pap tests and screening for cancer for at least 20 years after your treatment. If Pap tests have been discontinued, your risk factors (such as having a new sexual partner) need to be reassessed to determine if screening should resume. Some women have medical problems that increase the chance of getting cervical cancer. In these cases, your health care provider may recommend more frequent screening and Pap tests. Colorectal Cancer  This type of cancer can be detected and often prevented.  Routine colorectal cancer screening usually begins at 57 years of age and continues through 57 years of age.  Your health care provider may recommend screening at an earlier age if you have risk factors for colon cancer.  Your health care provider may also recommend using home test  kits to check for hidden blood in the stool.  A small camera at the end of a tube can be used to examine your colon directly (sigmoidoscopy or colonoscopy). This is done to check for the earliest forms of colorectal cancer.  Routine screening usually begins at age 29.  Direct examination of the colon should be repeated every 5-10 years through 57 years of age. However, you may need to be screened more often if early forms of precancerous polyps or small growths are found. Skin Cancer  Check your skin from head to toe regularly.  Tell your health care provider about any new moles or changes in moles, especially if there is a change in a mole's shape or color.  Also tell your health care provider if you have a mole that is larger than the size of a pencil eraser.  Always use sunscreen. Apply sunscreen liberally and repeatedly throughout the day.  Protect yourself by wearing long sleeves, pants, a wide-brimmed hat, and sunglasses whenever you are outside. Heart disease, diabetes, and high blood pressure  High blood pressure causes heart disease and increases the risk of stroke. High blood pressure is more likely to develop in:  People who have blood pressure in the high end of the normal range (130-139/85-89 mm Hg).  People who are overweight or obese.  People who are African American.  If you are 53-35 years of age, have your blood pressure checked every 3-5 years. If you are 75 years of age or older, have your blood pressure checked every year. You should have your blood pressure measured twice-once when you are at a hospital or  clinic, and once when you are not at a hospital or clinic. Record the average of the two measurements. To check your blood pressure when you are not at a hospital or clinic, you can use:  An automated blood pressure machine at a pharmacy.  A home blood pressure monitor.  If you are between 39 years and 22 years old, ask your health care provider if you should  take aspirin to prevent strokes.  Have regular diabetes screenings. This involves taking a blood sample to check your fasting blood sugar level.  If you are at a normal weight and have a low risk for diabetes, have this test once every three years after 57 years of age.  If you are overweight and have a high risk for diabetes, consider being tested at a younger age or more often. Preventing infection Hepatitis B  If you have a higher risk for hepatitis B, you should be screened for this virus. You are considered at high risk for hepatitis B if:  You were born in a country where hepatitis B is common. Ask your health care provider which countries are considered high risk.  Your parents were born in a high-risk country, and you have not been immunized against hepatitis B (hepatitis B vaccine).  You have HIV or AIDS.  You use needles to inject street drugs.  You live with someone who has hepatitis B.  You have had sex with someone who has hepatitis B.  You get hemodialysis treatment.  You take certain medicines for conditions, including cancer, organ transplantation, and autoimmune conditions. Hepatitis C  Blood testing is recommended for:  Everyone born from [birth year] through 1966.  Anyone with known risk factors for hepatitis C. Sexually transmitted infections (STIs)  You should be screened for sexually transmitted infections (STIs) including gonorrhea and chlamydia if:  You are sexually active and are younger than 57 years of age.  You are older than 57 years of age and your health care provider tells you that you are at risk for this type of infection.  Your sexual activity has changed since you were last screened and you are at an increased risk for chlamydia or gonorrhea. Ask your health care provider if you are at risk.  If you do not have HIV, but are at risk, it may be recommended that you take a prescription medicine daily to prevent HIV infection. This is called  pre-exposure prophylaxis (PrEP). You are considered at risk if:  You are sexually active and do not regularly use condoms or know the HIV status of your partner(s).  You take drugs by injection.  You are sexually active with a partner who has HIV. Talk with your health care provider about whether you are at high risk of being infected with HIV. If you choose to begin PrEP, you should first be tested for HIV. You should then be tested every 3 months for as long as you are taking PrEP. Pregnancy  If you are premenopausal and you may become pregnant, ask your health care provider about preconception counseling.  If you may become pregnant, take 400 to 800 micrograms (mcg) of folic acid every day.  If you want to prevent pregnancy, talk to your health care provider about birth control (contraception). Osteoporosis and menopause  Osteoporosis is a disease in which the bones lose minerals and strength with aging. This can result in serious bone fractures. Your risk for osteoporosis can be identified using a bone density scan.  If you  are 73 years of age or older, or if you are at risk for osteoporosis and fractures, ask your health care provider if you should be screened.  Ask your health care provider whether you should take a calcium or vitamin D supplement to lower your risk for osteoporosis.  Menopause may have certain physical symptoms and risks.  Hormone replacement therapy may reduce some of these symptoms and risks. Talk to your health care provider about whether hormone replacement therapy is right for you. Follow these instructions at home:  Schedule regular health, dental, and eye exams.  Stay current with your immunizations.  Do not use any tobacco products including cigarettes, chewing tobacco, or electronic cigarettes.  If you are pregnant, do not drink alcohol.  If you are breastfeeding, limit how much and how often you drink alcohol.  Limit alcohol intake to no more  than 1 drink per day for nonpregnant women. One drink equals 12 ounces of beer, 5 ounces of wine, or 1 ounces of hard liquor.  Do not use street drugs.  Do not share needles.  Ask your health care provider for help if you need support or information about quitting drugs.  Tell your health care provider if you often feel depressed.  Tell your health care provider if you have ever been abused or do not feel safe at home. This information is not intended to replace advice given to you by your health care provider. Make sure you discuss any questions you have with your health care provider. Document Released: 12/09/2010 Document Revised: 11/01/2015 Document Reviewed: 02/27/2015 Elsevier Interactive Patient Education  2017 [place] [facility]) Exercise Recommendation  Being physically active is important to prevent heart disease and stroke, the nation's No. 1and No. 5killers. To improve overall cardiovascular health, we suggest at least 150 minutes per week of moderate exercise or 75 minutes per week of vigorous exercise (or a combination of moderate and vigorous activity). Thirty minutes a day, five times a week is an easy goal to remember. You will also experience benefits even if you divide your time into two or three segments of 10 to 15 minutes per day.  For people who would benefit from lowering their blood pressure or cholesterol, we recommend 40 minutes of aerobic exercise of moderate to vigorous intensity three to four times a week to lower the risk for heart attack and stroke.  Physical activity is anything that makes you move your body and burn calories.  This includes things like climbing stairs or playing sports. Aerobic exercises benefit your heart, and include walking, jogging, swimming or biking. Strength and stretching exercises are best for overall stamina and flexibility.  The simplest, positive change you can make to effectively improve your heart  health is to start walking. It's enjoyable, free, easy, social and great exercise. A walking program is flexible and boasts high success rates because people can stick with it. It's easy for walking to become a regular and satisfying part of life.   For Overall Cardiovascular Health:  At least 30 minutes of moderate-intensity aerobic activity at least 5 days per week for a total of 150  OR   At least 25 minutes of vigorous aerobic activity at least 3 days per week for a total of 75 minutes; or a combination of moderate- and vigorous-intensity aerobic activity  AND   Moderate- to high-intensity muscle-strengthening activity at least 2 days per week for additional health benefits.  For Lowering Blood Pressure and Cholesterol  An average 40 minutes of moderate- to vigorous-intensity aerobic activity 3 or 4 times per week  What if I can't make it to the time goal? Something is always better than nothing! And everyone has to start somewhere. Even if you've been sedentary for years, today is the day you can begin to make healthy changes in your life. If you don't think you'll make it for 30 or 40 minutes, set a reachable goal for today. You can work up toward your overall goal by increasing your time as you get stronger. Don't let all-or-nothing thinking rob you of doing what you can every day.  Source:http://www.heart.Burnadette Pop, MD [facility]

## 2016-10-31 ENCOUNTER — Encounter: Payer: Self-pay | Admitting: Radiology

## 2016-10-31 LAB — COMPREHENSIVE METABOLIC PANEL
A/G RATIO: 1.3 (ref 1.2–2.2)
ALBUMIN: 4.2 g/dL (ref 3.5–5.5)
ALT: 11 IU/L (ref 0–32)
AST: 17 IU/L (ref 0–40)
Alkaline Phosphatase: 41 IU/L (ref 39–117)
BUN / CREAT RATIO: 13 (ref 9–23)
BUN: 10 mg/dL (ref 6–24)
Bilirubin Total: 0.4 mg/dL (ref 0.0–1.2)
CALCIUM: 9.8 mg/dL (ref 8.7–10.2)
CO2: 23 mmol/L (ref 18–29)
CREATININE: 0.78 mg/dL (ref 0.57–1.00)
Chloride: 103 mmol/L (ref 96–106)
GFR calc Af Amer: 98 mL/min/{1.73_m2} (ref >59)
GFR, EST NON AFRICAN AMERICAN: 85 mL/min/{1.73_m2} (ref >59)
GLOBULIN, TOTAL: 3.3 g/dL (ref 1.5–4.5)
Glucose: 92 mg/dL (ref 65–99)
POTASSIUM: 3.8 mmol/L (ref 3.5–5.2)
SODIUM: 140 mmol/L (ref 134–144)
Total Protein: 7.5 g/dL (ref 6.0–8.5)

## 2016-10-31 LAB — CBC WITH DIFFERENTIAL/PLATELET
BASOS: 1 %
Basophils Absolute: 0 10*3/uL (ref 0.0–0.2)
EOS (ABSOLUTE): 0.1 10*3/uL (ref 0.0–0.4)
EOS: 1 %
HEMATOCRIT: 40.2 % (ref 34.0–46.6)
HEMOGLOBIN: 13.3 g/dL (ref 11.1–15.9)
IMMATURE GRANULOCYTES: 0 %
Immature Grans (Abs): 0 10*3/uL (ref 0.0–0.1)
Lymphocytes Absolute: 2 10*3/uL (ref 0.7–3.1)
Lymphs: 50 %
MCH: 28.9 pg (ref 26.6–33.0)
MCHC: 33.1 g/dL (ref 31.5–35.7)
MCV: 87 fL (ref 79–97)
MONOCYTES: 6 %
MONOS ABS: 0.3 10*3/uL (ref 0.1–0.9)
Neutrophils Absolute: 1.7 10*3/uL (ref 1.4–7.0)
Neutrophils: 42 %
Platelets: 366 10*3/uL (ref 150–379)
RBC: 4.6 x10E6/uL (ref 3.77–5.28)
RDW: 13.5 % (ref 12.3–15.4)
WBC: 4.1 10*3/uL (ref 3.4–10.8)

## 2016-10-31 LAB — HEMOGLOBIN A1C
ESTIMATED AVERAGE GLUCOSE: 108 mg/dL
Hgb A1c MFr Bld: 5.4 % (ref 4.8–5.6)

## 2016-10-31 LAB — LIPID PANEL
CHOL/HDL RATIO: 3.7 ratio (ref 0.0–4.4)
Cholesterol, Total: 220 mg/dL — ABNORMAL HIGH (ref 100–199)
HDL: 60 mg/dL (ref >39)
LDL CALC: 149 mg/dL — AB (ref 0–99)
Triglycerides: 55 mg/dL (ref 0–149)
VLDL CHOLESTEROL CAL: 11 mg/dL (ref 5–40)

## 2016-10-31 LAB — TSH: TSH: 0.583 u[IU]/mL (ref 0.450–4.500)

## 2016-10-31 LAB — HIV ANTIBODY (ROUTINE TESTING W REFLEX): HIV SCREEN 4TH GENERATION: NONREACTIVE

## 2016-12-22 ENCOUNTER — Encounter: Payer: Self-pay | Admitting: Emergency Medicine

## 2017-01-29 IMAGING — US US SOFT TISSUE HEAD/NECK
1 series · 14 of 25 positions shown · non-contrast
Comparison: 01/21/2012

CLINICAL DATA: Multinodular thyroid goiter.

EXAM:
THYROID ULTRASOUND
TECHNIQUE: Ultrasound examination of the thyroid gland and adjacent soft
tissues was performed.

[Series 1: us soft tissue head/neck · 0.09mm/px · 14 of 52 slices shown]
[im 1/52]
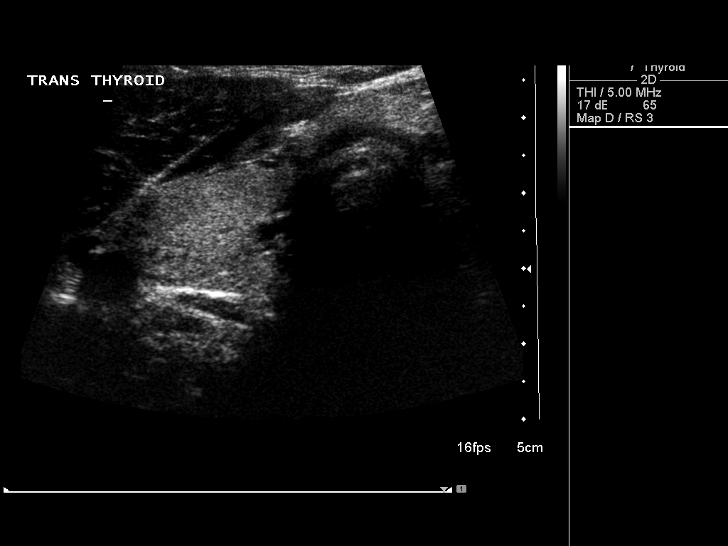
[im 5/52]
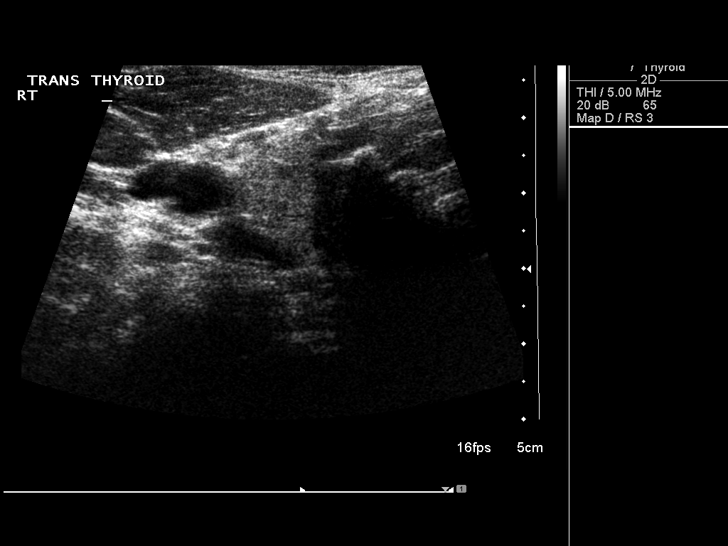
[im 9/52]
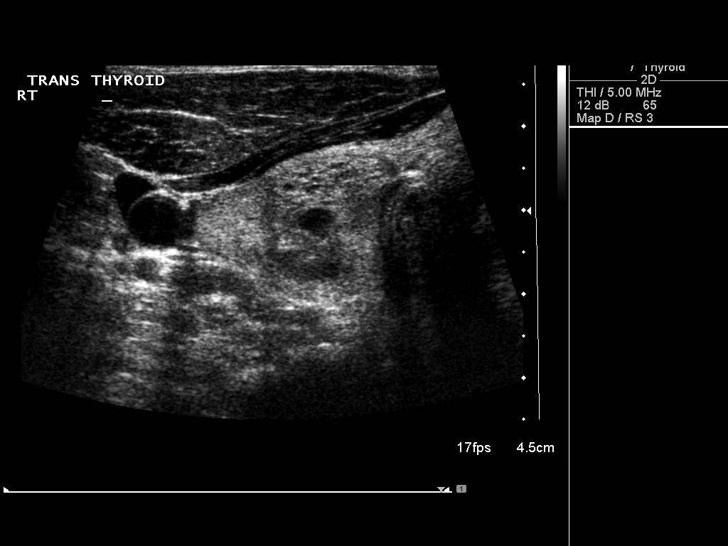
[im 13/52]
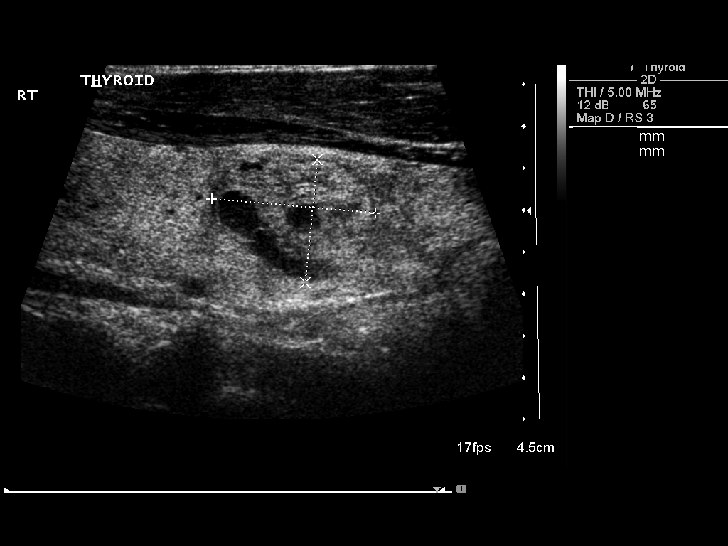
[im 18/52]
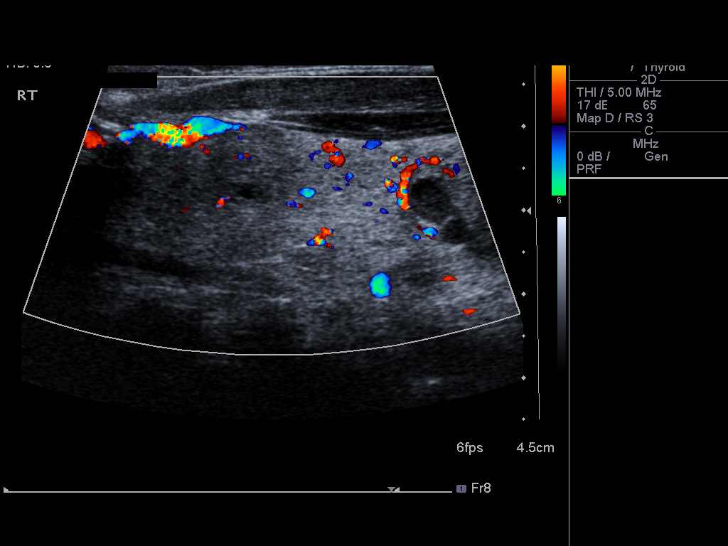
[im 20/52]
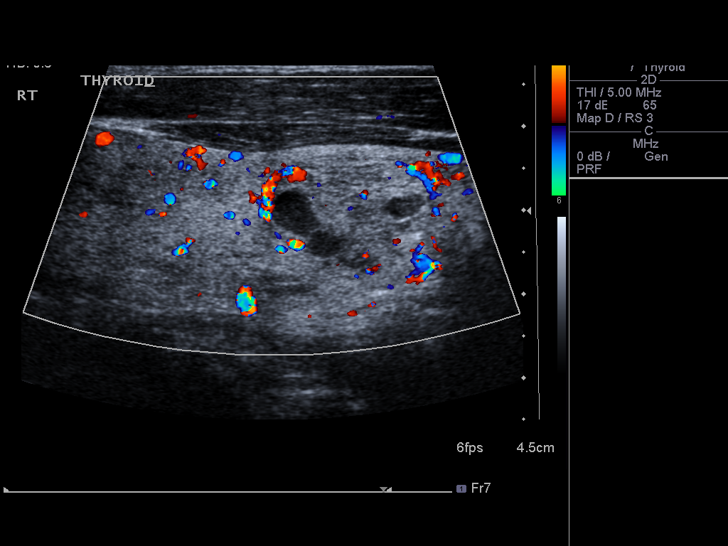
[im 24/52]
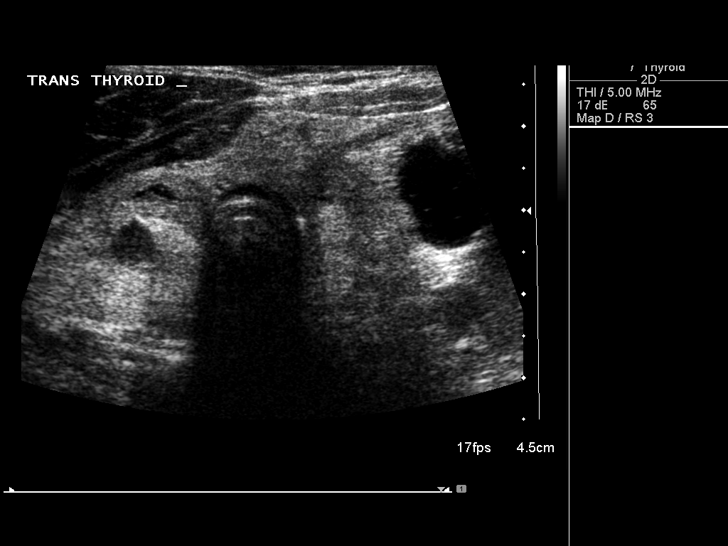
[im 28/52]
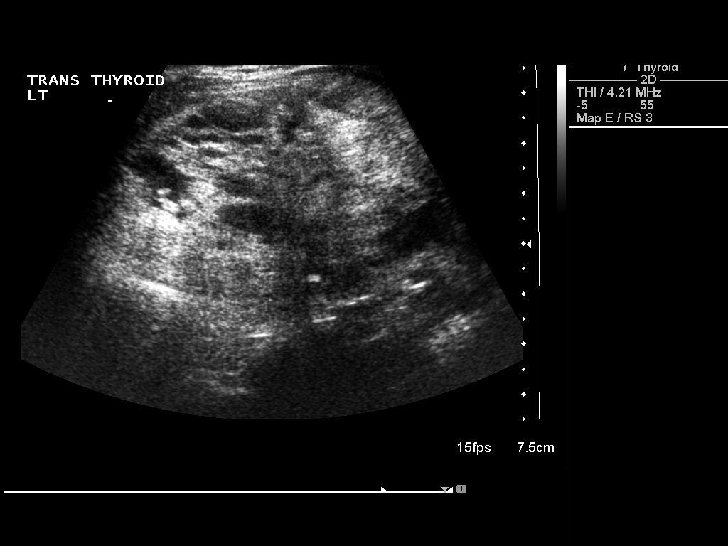
[im 32/52]
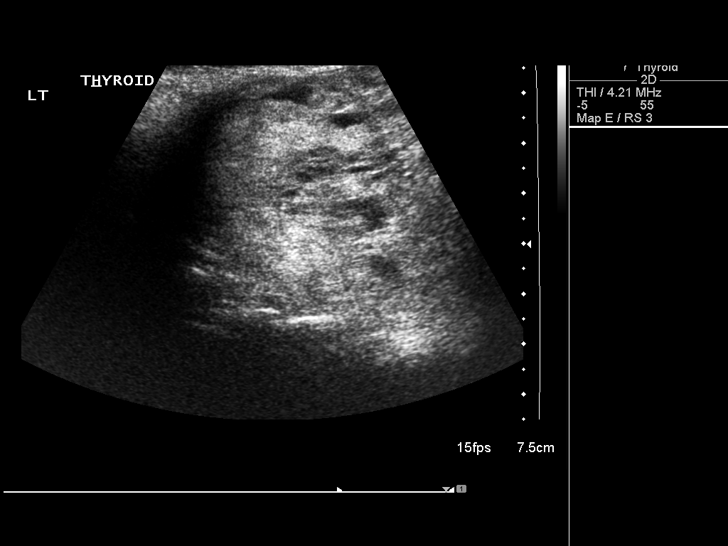
[im 35/52]
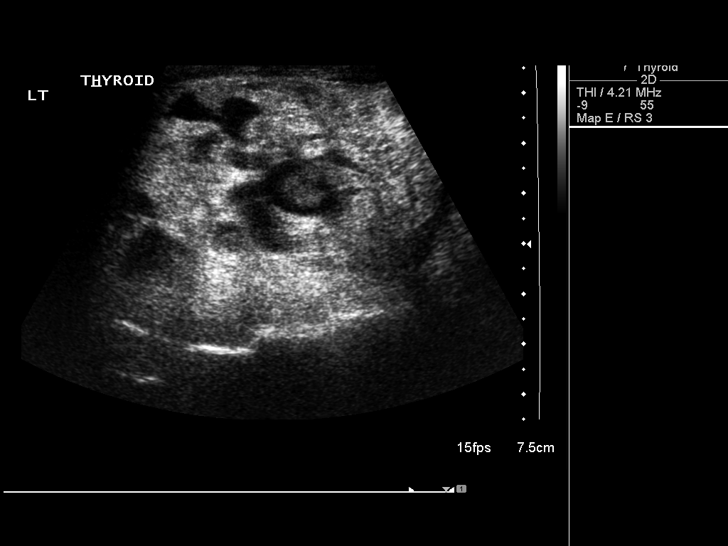
[im 39/52]
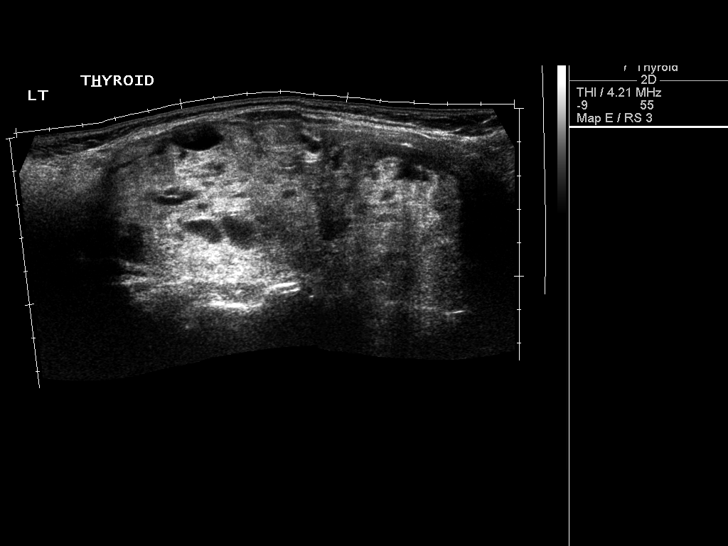
[im 43/52]
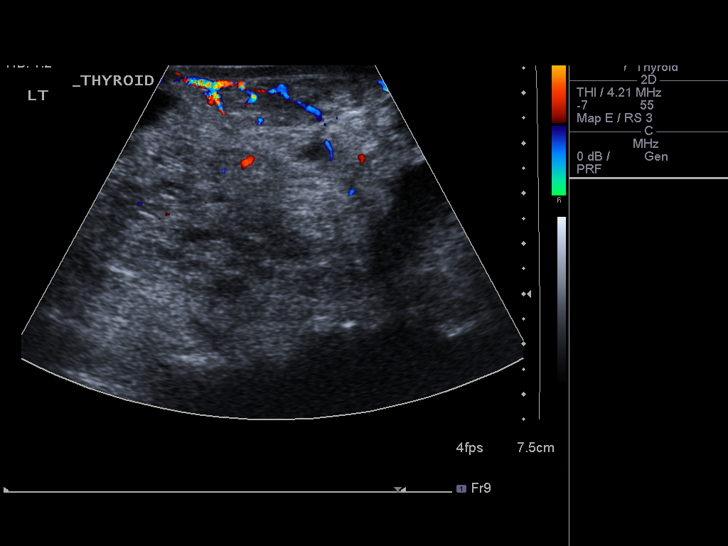
[im 47/52]
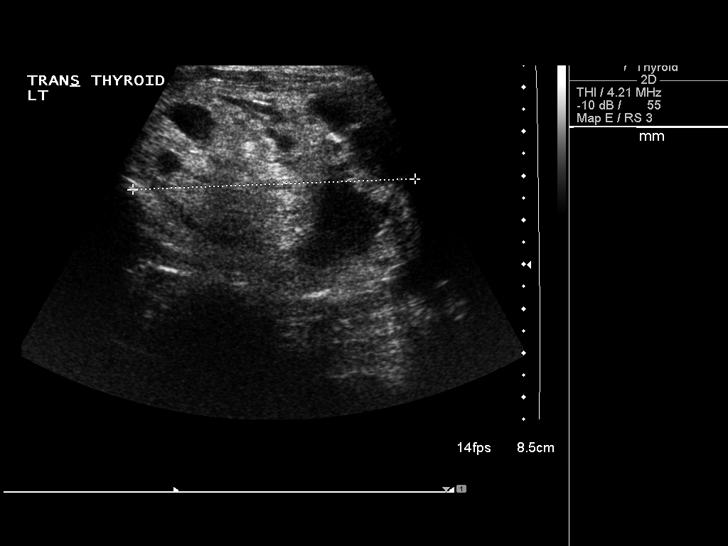
[im 52/52]
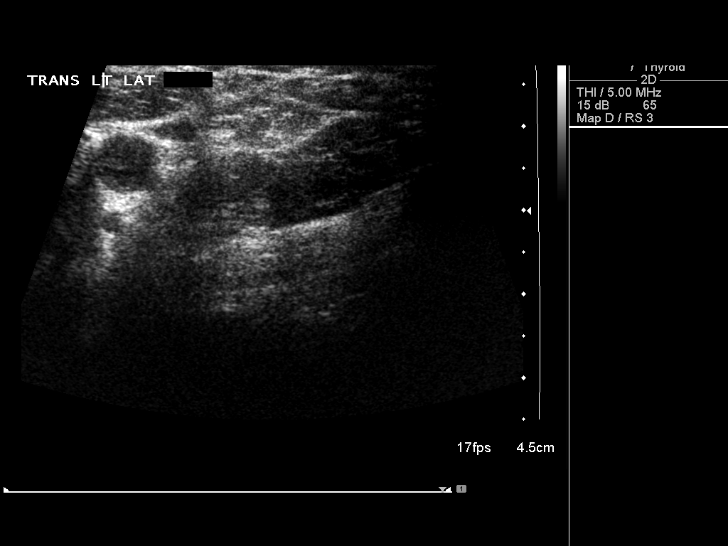

[14 of 25 positions shown; findings below may reference images not displayed]

FINDINGS: Right thyroid lobe

Measurements: 8.1 x 2.0 x 2.5 cm. Partially solid and partially
cystic nodule in the mid to inferior right lobe measures
approximately 2.0 x 1.5 x 1.6 cm. Maximum diameter is stable
transverse with is slightly greater.

Left thyroid lobe

Measurements: 12.5 x 5.3 x 6.4 cm. The left lobe overall appears
larger by ultrasound. Heterogeneous and multinodular appearance of
the left lobe again noted without any discrete true measurable
nodules.

Isthmus

Thickness: 0.8 cm.  No nodules visualized.

Lymphadenopathy

None visualized.
IMPRESSION: 1. Slight increase in transverse diameter of a solitary right
thyroid nodule. This nodule remains very similar in morphology and
maximum diameter compared to the prior study.
2. Slightly larger dimensions of the left lobe compared to prior
studies with heterogeneous parenchyma again noted. Correlation
suggested with any clinical symptoms of mass-effect from asymmetric
enlargement.

## 2017-10-06 DIAGNOSIS — E042 Nontoxic multinodular goiter: Secondary | ICD-10-CM | POA: Diagnosis not present

## 2017-10-06 DIAGNOSIS — R061 Stridor: Secondary | ICD-10-CM | POA: Diagnosis not present

## 2017-10-22 ENCOUNTER — Other Ambulatory Visit: Payer: Self-pay | Admitting: Otolaryngology

## 2017-10-22 DIAGNOSIS — E042 Nontoxic multinodular goiter: Secondary | ICD-10-CM

## 2017-10-22 DIAGNOSIS — R061 Stridor: Secondary | ICD-10-CM

## 2017-10-28 ENCOUNTER — Ambulatory Visit
Admission: RE | Admit: 2017-10-28 | Discharge: 2017-10-28 | Disposition: A | Discharge status: Home / Self Care | Payer: Commercial Managed Care - PPO | Source: Ambulatory Visit | Attending: Otolaryngology | Admitting: Otolaryngology

## 2017-10-28 DIAGNOSIS — E042 Nontoxic multinodular goiter: Secondary | ICD-10-CM | POA: Diagnosis not present

## 2017-10-28 DIAGNOSIS — R061 Stridor: Secondary | ICD-10-CM

## 2017-10-28 MED ORDER — IOPAMIDOL (ISOVUE-300) INJECTION 61%
75.0000 mL | Freq: Once | INTRAVENOUS | Status: AC | PRN
  Administered 2017-10-28: 75 mL via INTRAVENOUS

## 2017-11-02 ENCOUNTER — Encounter: Payer: Self-pay | Admitting: Family Medicine

## 2017-11-03 ENCOUNTER — Other Ambulatory Visit: Payer: Commercial Managed Care - PPO

## 2017-11-03 ENCOUNTER — Encounter: Payer: Commercial Managed Care - PPO | Admitting: Emergency Medicine

## 2017-11-04 ENCOUNTER — Other Ambulatory Visit: Payer: Self-pay | Admitting: Otolaryngology

## 2017-11-06 ENCOUNTER — Encounter: Payer: Commercial Managed Care - PPO | Admitting: Emergency Medicine

## 2017-11-27 ENCOUNTER — Ambulatory Visit: Admit: 2017-11-27 | Payer: Commercial Managed Care - PPO | Admitting: Otolaryngology

## 2017-11-27 SURGERY — THYROIDECTOMY
Anesthesia: General | Laterality: Bilateral

## 2017-12-16 NOTE — Pre-Procedure Instructions (Signed)
   Grace [name]  12/16/2017     [id number] - [place], [place] - [place] [address] Phone: [phone] Fax: [phone]  [id number] - [place] [address] [place] [address] Phone: [phone] Fax: [phone]   Your procedure is scheduled on Friday, December 25, 2017  Report to [facility] Admitting at 7:15 A.M.  Call this number if you have problems the morning of surgery:  [phone]   Remember:  Do not eat or drink after midnight Thursday, December 24, 2017  Take these medicines the morning of surgery with A SIP OF WATER : none  Stop taking Aspirin (unless advised otherwise by your surgeon), vitamins, fish oil and herbal medications. Do not take any NSAIDs ie: Ibuprofen, Advil, Naproxen (Aleve), Motrin, BC and Goody Powder; stop 7 days prior to surgery (Friday, December 18, 2017).  Do not wear jewelry, make-up or nail polish.  Do not wear lotions, powders, or perfumes, or deodorant.  Do not shave 48 hours prior to surgery.    Do not bring valuables to the hospital.  [facility] is not responsible for any belongings or valuables. Contacts, dentures or bridgework may not be worn into surgery.  Leave your suitcase in the car.  After surgery it may be brought to your room. Special instructions:Shower the night before and morning of surgery with CHG Please read over the following fact sheets that you were given. Pain Booklet and Surgical Site Infection Prevention

## 2017-12-17 ENCOUNTER — Encounter (HOSPITAL_COMMUNITY)
Admission: RE | Admit: 2017-12-17 | Discharge: 2017-12-17 | Disposition: A | Discharge status: Home / Self Care | Payer: Commercial Managed Care - PPO | Source: Ambulatory Visit | Attending: Otolaryngology | Admitting: Otolaryngology

## 2017-12-17 ENCOUNTER — Other Ambulatory Visit: Payer: Self-pay

## 2017-12-17 ENCOUNTER — Encounter (HOSPITAL_COMMUNITY): Payer: Self-pay

## 2017-12-17 ENCOUNTER — Ambulatory Visit (HOSPITAL_COMMUNITY)
Admission: RE | Admit: 2017-12-17 | Discharge: 2017-12-17 | Disposition: A | Discharge status: Home / Self Care | Payer: Commercial Managed Care - PPO | Source: Ambulatory Visit | Attending: Anesthesiology | Admitting: Anesthesiology

## 2017-12-17 DIAGNOSIS — R9431 Abnormal electrocardiogram [ECG] [EKG]: Secondary | ICD-10-CM | POA: Diagnosis not present

## 2017-12-17 DIAGNOSIS — E049 Nontoxic goiter, unspecified: Secondary | ICD-10-CM | POA: Diagnosis not present

## 2017-12-17 DIAGNOSIS — Z01818 Encounter for other preprocedural examination: Secondary | ICD-10-CM | POA: Diagnosis not present

## 2017-12-17 DIAGNOSIS — Z01812 Encounter for preprocedural laboratory examination: Secondary | ICD-10-CM | POA: Diagnosis present

## 2017-12-17 DIAGNOSIS — I10 Essential (primary) hypertension: Secondary | ICD-10-CM | POA: Diagnosis not present

## 2017-12-17 DIAGNOSIS — Z01810 Encounter for preprocedural cardiovascular examination: Secondary | ICD-10-CM | POA: Diagnosis present

## 2017-12-17 DIAGNOSIS — I444 Left anterior fascicular block: Secondary | ICD-10-CM | POA: Diagnosis not present

## 2017-12-17 DIAGNOSIS — J9811 Atelectasis: Secondary | ICD-10-CM | POA: Diagnosis not present

## 2017-12-17 HISTORY — DX: Unspecified hearing loss, unspecified ear: H91.90

## 2017-12-17 HISTORY — DX: Presence of dental prosthetic device (complete) (partial): Z97.2

## 2017-12-17 LAB — CBC
HCT: 41.1 % (ref 36.0–46.0)
HEMOGLOBIN: 13.1 g/dL (ref 12.0–15.0)
MCH: 28.4 pg (ref 26.0–34.0)
MCHC: 31.9 g/dL (ref 30.0–36.0)
MCV: 89.2 fL (ref 78.0–100.0)
Platelets: 349 10*3/uL (ref 150–400)
RBC: 4.61 MIL/uL (ref 3.87–5.11)
RDW: 12.5 % (ref 11.5–15.5)
WBC: 5.7 10*3/uL (ref 4.0–10.5)

## 2017-12-17 LAB — BASIC METABOLIC PANEL
ANION GAP: 8 (ref 5–15)
BUN: 8 mg/dL (ref 6–20)
CALCIUM: 9.6 mg/dL (ref 8.9–10.3)
CO2: 29 mmol/L (ref 22–32)
Chloride: 104 mmol/L (ref 98–111)
Creatinine, Ser: 0.95 mg/dL (ref 0.44–1.00)
Glucose, Bld: 88 mg/dL (ref 70–99)
Potassium: 3.2 mmol/L — ABNORMAL LOW (ref 3.5–5.1)
Sodium: 141 mmol/L (ref 135–145)

## 2017-12-17 NOTE — Progress Notes (Signed)
Pt denies SOB, chest pain, and being under the care of a cardiologist. Pt denies having a stress test, echo and cardiac cath. Pt denies having an EKG and chest x ray within the last year. Pt denies having a CT scan within the last month. Pt denies recent labs. Pt stated that she does not take Aspirin. Pt chart forwarded to anesthesia for review.

## 2017-12-18 NOTE — Progress Notes (Signed)
Anesthesia Chart Review:  Case:  [id number] Date/Time:  12/25/17 0900   Procedure:  THYROIDECTOMY (Bilateral )   Anesthesia type:  General   Pre-op diagnosis:  Multi nodule goiter   Location:  [facility] OR ROOM 09 / [facility]   Surgeon:  Melida Quitter, MD      DISCUSSION: 57 yo female never smoker scheduled for above procedure. Pertinent medical hx includes HTN, multinodular goiter, HOH.  Asked to review by PAT for LAFB on EKG. Compared to previous tracings there is minimal interval change. Expect can proceed with surgery as planned barring any acute status change.  VS: BP 134/85   Pulse 93   Temp 37.1 C (Oral)   Resp 18   Ht 5' 5.5" (1.664 m)   Wt 182 lb 4 oz (82.7 kg)   LMP 04/10/2012   SpO2 96%   BMI 29.87 kg/m   PROVIDERS: Patient, No Pcp Per   LABS: Labs reviewed: Acceptable for surgery. (all labs ordered are listed, but only abnormal results are displayed)  Labs Reviewed  BASIC METABOLIC PANEL - Abnormal; Notable for the following components:      Result Value   Potassium 3.2 (*)    All other components within normal limits  CBC     IMAGES: CHEST - 2 VIEW 12/17/2017 COMPARISON:  Chest radiograph 02/11/2008 FINDINGS: Stable cardiac and mediastinal contours. Low lung volumes. Bibasilar atelectasis. No large area pulmonary consolidation. No pleural effusion or pneumothorax. Regional skeleton is unremarkable. IMPRESSION: No acute cardiopulmonary process   EKG: 12/17/2017:  Normal sinus rhythm. Left anterior fascicular block. Moderate voltage criteria for LVH, may be normal variant 07/10/2015: Sinus  Rhythm. Left axis -anterior fascicular block. Voltage criteria for LVH   CV: N/A  Past Medical History:  Diagnosis Date  . Goiter 12/23/2011   multi nodule  . HOH (hard of hearing)   . Hypertension   . Wears partial dentures     Past Surgical History:  Procedure Laterality Date  . CESAREAN SECTION    . CYST EXCISION     thigh  . MULTIPLE TOOTH EXTRACTIONS    .  TUBAL LIGATION      MEDICATIONS: . amLODipine (NORVASC) 5 MG tablet  . lisinopril-hydrochlorothiazide (PRINZIDE,ZESTORETIC) 20-12.5 MG tablet  . metoprolol succinate (TOPROL-XL) 25 MG 24 hr tablet  . Multiple Vitamins-Minerals (MULTIVITAMIN PO)   No current facility-administered medications for this encounter.     Wynonia Musty [facility]/Anesthesiology Phone [phone] 12/18/2017 2:21 PM

## 2017-12-25 ENCOUNTER — Observation Stay (HOSPITAL_COMMUNITY)
Admission: RE | Admit: 2017-12-25 | Discharge: 2017-12-27 | Disposition: A | Discharge status: Home / Self Care | Payer: Commercial Managed Care - PPO | Source: Ambulatory Visit | Attending: Otolaryngology | Admitting: Otolaryngology

## 2017-12-25 ENCOUNTER — Encounter (HOSPITAL_COMMUNITY)
Admission: RE | Disposition: A | Discharge status: Home / Self Care | Payer: Self-pay | Source: Ambulatory Visit | Attending: Otolaryngology

## 2017-12-25 ENCOUNTER — Ambulatory Visit (HOSPITAL_COMMUNITY): Payer: Commercial Managed Care - PPO | Admitting: Physician Assistant

## 2017-12-25 ENCOUNTER — Ambulatory Visit (HOSPITAL_COMMUNITY): Payer: Commercial Managed Care - PPO | Admitting: Certified Registered Nurse Anesthetist

## 2017-12-25 ENCOUNTER — Encounter (HOSPITAL_COMMUNITY): Payer: Self-pay | Admitting: *Deleted

## 2017-12-25 DIAGNOSIS — E042 Nontoxic multinodular goiter: Secondary | ICD-10-CM | POA: Diagnosis not present

## 2017-12-25 DIAGNOSIS — J398 Other specified diseases of upper respiratory tract: Secondary | ICD-10-CM | POA: Insufficient documentation

## 2017-12-25 DIAGNOSIS — R061 Stridor: Secondary | ICD-10-CM | POA: Diagnosis not present

## 2017-12-25 DIAGNOSIS — I10 Essential (primary) hypertension: Secondary | ICD-10-CM | POA: Diagnosis present

## 2017-12-25 DIAGNOSIS — E049 Nontoxic goiter, unspecified: Secondary | ICD-10-CM | POA: Diagnosis not present

## 2017-12-25 HISTORY — PX: THYROIDECTOMY: SHX17

## 2017-12-25 LAB — CALCIUM
CALCIUM: 9.3 mg/dL (ref 8.9–10.3)
CALCIUM: 8.9 mg/dL (ref 8.9–10.3)

## 2017-12-25 SURGERY — THYROIDECTOMY
Anesthesia: General | Site: Neck | Laterality: Bilateral

## 2017-12-25 MED ORDER — PROPOFOL 10 MG/ML IV BOLUS
INTRAVENOUS | Status: AC
  Filled 2017-12-25: qty 20

## 2017-12-25 MED ORDER — 0.9 % SODIUM CHLORIDE (POUR BTL) OPTIME
TOPICAL | Status: DC | PRN
  Administered 2017-12-25: 1000 mL

## 2017-12-25 MED ORDER — HYDROCODONE-ACETAMINOPHEN 5-325 MG PO TABS: 1.0000 | ORAL_TABLET | ORAL | Status: DC | PRN

## 2017-12-25 MED ORDER — CLINDAMYCIN PHOSPHATE 600 MG/50ML IV SOLN
600.0000 mg | INTRAVENOUS | Status: AC
  Administered 2017-12-25: 600 mg via INTRAVENOUS
  Filled 2017-12-25: qty 50

## 2017-12-25 MED ORDER — LIDOCAINE 2% (20 MG/ML) 5 ML SYRINGE
INTRAMUSCULAR | Status: DC | PRN
  Administered 2017-12-25: 100 mg via INTRAVENOUS

## 2017-12-25 MED ORDER — SUCCINYLCHOLINE CHLORIDE 20 MG/ML IJ SOLN
INTRAMUSCULAR | Status: DC | PRN
  Administered 2017-12-25: 120 mg via INTRAVENOUS

## 2017-12-25 MED ORDER — CLINDAMYCIN PHOSPHATE 600 MG/50ML IV SOLN
600.0000 mg | Freq: Three times a day (TID) | INTRAVENOUS | Status: AC
  Administered 2017-12-25 – 2017-12-26 (×3): 600 mg via INTRAVENOUS
  Filled 2017-12-25 (×3): qty 50

## 2017-12-25 MED ORDER — PROPOFOL 10 MG/ML IV BOLUS
INTRAVENOUS | Status: DC | PRN
  Administered 2017-12-25: 150 mg via INTRAVENOUS
  Administered 2017-12-25: 50 mg via INTRAVENOUS

## 2017-12-25 MED ORDER — HYDROCODONE-ACETAMINOPHEN 5-325 MG PO TABS: 1.0000 | ORAL_TABLET | Freq: Four times a day (QID) | ORAL | 0 refills | Status: DC | PRN

## 2017-12-25 MED ORDER — LIDOCAINE-EPINEPHRINE 1 %-1:100000 IJ SOLN
INTRAMUSCULAR | Status: AC
  Filled 2017-12-25: qty 1

## 2017-12-25 MED ORDER — LIDOCAINE-EPINEPHRINE 1 %-1:100000 IJ SOLN
INTRAMUSCULAR | Status: DC | PRN
  Administered 2017-12-25: 7.5 mL

## 2017-12-25 MED ORDER — LEVOTHYROXINE SODIUM 75 MCG PO TABS
150.0000 ug | ORAL_TABLET | Freq: Every day | ORAL | Status: DC
  Administered 2017-12-26 – 2017-12-27 (×2): 150 ug via ORAL
  Filled 2017-12-25 (×2): qty 2

## 2017-12-25 MED ORDER — ONDANSETRON HCL 4 MG/2ML IJ SOLN
INTRAMUSCULAR | Status: DC | PRN
  Administered 2017-12-25: 4 mg via INTRAVENOUS

## 2017-12-25 MED ORDER — MORPHINE SULFATE (PF) 2 MG/ML IV SOLN: 2.0000 mg | INTRAVENOUS | Status: DC | PRN

## 2017-12-25 MED ORDER — FENTANYL CITRATE (PF) 100 MCG/2ML IJ SOLN
25.0000 ug | INTRAMUSCULAR | Status: DC | PRN
  Administered 2017-12-25 (×2): 50 ug via INTRAVENOUS

## 2017-12-25 MED ORDER — KCL IN DEXTROSE-NACL 20-5-0.45 MEQ/L-%-% IV SOLN
INTRAVENOUS | Status: DC
  Administered 2017-12-25 – 2017-12-26 (×2): via INTRAVENOUS
  Filled 2017-12-25 (×2): qty 1000

## 2017-12-25 MED ORDER — ONDANSETRON HCL 4 MG/2ML IJ SOLN: 4.0000 mg | Freq: Once | INTRAMUSCULAR | Status: DC | PRN

## 2017-12-25 MED ORDER — DEXAMETHASONE SODIUM PHOSPHATE 10 MG/ML IJ SOLN
INTRAMUSCULAR | Status: DC | PRN
  Administered 2017-12-25: 10 mg via INTRAVENOUS

## 2017-12-25 MED ORDER — FENTANYL CITRATE (PF) 250 MCG/5ML IJ SOLN
INTRAMUSCULAR | Status: AC
  Filled 2017-12-25: qty 5

## 2017-12-25 MED ORDER — DEXAMETHASONE SODIUM PHOSPHATE 10 MG/ML IJ SOLN
INTRAMUSCULAR | Status: AC
  Filled 2017-12-25: qty 1

## 2017-12-25 MED ORDER — LISINOPRIL 20 MG PO TABS
20.0000 mg | ORAL_TABLET | Freq: Every day | ORAL | Status: DC
  Administered 2017-12-25 – 2017-12-27 (×3): 20 mg via ORAL
  Filled 2017-12-25 (×3): qty 1

## 2017-12-25 MED ORDER — LEVOTHYROXINE SODIUM 150 MCG PO TABS: 150.0000 ug | ORAL_TABLET | Freq: Every day | ORAL | 11 refills | Status: DC

## 2017-12-25 MED ORDER — SUCCINYLCHOLINE CHLORIDE 200 MG/10ML IV SOSY
PREFILLED_SYRINGE | INTRAVENOUS | Status: AC
  Filled 2017-12-25: qty 10

## 2017-12-25 MED ORDER — ONDANSETRON HCL 4 MG/2ML IJ SOLN: 4.0000 mg | INTRAMUSCULAR | Status: DC | PRN

## 2017-12-25 MED ORDER — PHENYLEPHRINE 40 MCG/ML (10ML) SYRINGE FOR IV PUSH (FOR BLOOD PRESSURE SUPPORT)
PREFILLED_SYRINGE | INTRAVENOUS | Status: DC | PRN
  Administered 2017-12-25: 80 ug via INTRAVENOUS

## 2017-12-25 MED ORDER — MIDAZOLAM HCL 2 MG/2ML IJ SOLN
INTRAMUSCULAR | Status: AC
  Filled 2017-12-25: qty 2

## 2017-12-25 MED ORDER — LISINOPRIL-HYDROCHLOROTHIAZIDE 20-12.5 MG PO TABS: 1.0000 | ORAL_TABLET | Freq: Every day | ORAL | Status: DC

## 2017-12-25 MED ORDER — FENTANYL CITRATE (PF) 100 MCG/2ML IJ SOLN
INTRAMUSCULAR | Status: DC | PRN
  Administered 2017-12-25: 50 ug via INTRAVENOUS

## 2017-12-25 MED ORDER — SODIUM CHLORIDE 0.9 % IV SOLN
0.0125 ug/kg/min | INTRAVENOUS | Status: AC
  Administered 2017-12-25: .1 ug/kg/min via INTRAVENOUS
  Filled 2017-12-25: qty 2000

## 2017-12-25 MED ORDER — ONDANSETRON HCL 4 MG PO TABS: 4.0000 mg | ORAL_TABLET | ORAL | Status: DC | PRN

## 2017-12-25 MED ORDER — FENTANYL CITRATE (PF) 100 MCG/2ML IJ SOLN
INTRAMUSCULAR | Status: AC
  Filled 2017-12-25: qty 2

## 2017-12-25 MED ORDER — LACTATED RINGERS IV SOLN
INTRAVENOUS | Status: DC
  Administered 2017-12-25 (×2): via INTRAVENOUS

## 2017-12-25 MED ORDER — HYDROCHLOROTHIAZIDE 12.5 MG PO CAPS
12.5000 mg | ORAL_CAPSULE | Freq: Every day | ORAL | Status: DC
  Administered 2017-12-25 – 2017-12-27 (×3): 12.5 mg via ORAL
  Filled 2017-12-25 (×4): qty 1

## 2017-12-25 MED ORDER — SODIUM CHLORIDE 0.9 % IV SOLN
INTRAVENOUS | Status: DC | PRN
  Administered 2017-12-25: 30 ug/min via INTRAVENOUS

## 2017-12-25 MED ORDER — LIDOCAINE 2% (20 MG/ML) 5 ML SYRINGE
INTRAMUSCULAR | Status: AC
  Filled 2017-12-25: qty 5

## 2017-12-25 MED ORDER — ONDANSETRON HCL 4 MG/2ML IJ SOLN
INTRAMUSCULAR | Status: AC
  Filled 2017-12-25: qty 2

## 2017-12-25 SURGICAL SUPPLY — 43 items
APPLIER CLIP 9.375 MED OPEN (MISCELLANEOUS) ×2
BLADE SURG 15 STRL LF DISP TIS (BLADE) IMPLANT
BLADE SURG 15 STRL SS (BLADE)
CANISTER SUCT 3000ML PPV (MISCELLANEOUS) ×2 IMPLANT
CLEANER TIP ELECTROSURG 2X2 (MISCELLANEOUS) ×2 IMPLANT
CLIP APPLIE 9.375 MED OPEN (MISCELLANEOUS) ×1 IMPLANT
CONT SPEC 4OZ CLIKSEAL STRL BL (MISCELLANEOUS) IMPLANT
CORDS BIPOLAR (ELECTRODE) ×2 IMPLANT
COVER SURGICAL LIGHT HANDLE (MISCELLANEOUS) ×2 IMPLANT
CRADLE DONUT ADULT HEAD (MISCELLANEOUS) IMPLANT
DERMABOND ADVANCED (GAUZE/BANDAGES/DRESSINGS) ×1
DERMABOND ADVANCED .7 DNX12 (GAUZE/BANDAGES/DRESSINGS) ×1 IMPLANT
DRAIN JACKSON RD 7FR 3/32 (WOUND CARE) IMPLANT
DRAIN SNY 10 ROU (WOUND CARE) ×2 IMPLANT
DRAPE HALF SHEET 40X57 (DRAPES) IMPLANT
ELECT COATED BLADE 2.86 ST (ELECTRODE) ×2 IMPLANT
ELECT REM PT RETURN 9FT ADLT (ELECTROSURGICAL) ×2
ELECTRODE REM PT RTRN 9FT ADLT (ELECTROSURGICAL) ×1 IMPLANT
EVACUATOR SILICONE 100CC (DRAIN) ×2 IMPLANT
FORCEPS BIPOLAR SPETZLER 8 1.0 (NEUROSURGERY SUPPLIES) ×2 IMPLANT
GAUZE SPONGE 4X4 16PLY XRAY LF (GAUZE/BANDAGES/DRESSINGS) ×8 IMPLANT
GLOVE BIO SURGEON STRL SZ7.5 (GLOVE) ×2 IMPLANT
GOWN STRL REUS W/ TWL LRG LVL3 (GOWN DISPOSABLE) ×2 IMPLANT
GOWN STRL REUS W/TWL LRG LVL3 (GOWN DISPOSABLE) ×2
HEMOSTAT SURGICEL 2X14 (HEMOSTASIS) ×2 IMPLANT
KIT BASIN OR (CUSTOM PROCEDURE TRAY) ×2 IMPLANT
KIT TURNOVER KIT B (KITS) ×2 IMPLANT
LOCATOR NERVE 3 VOLT (DISPOSABLE) IMPLANT
NEEDLE HYPO 25GX1X1/2 BEV (NEEDLE) ×2 IMPLANT
NS IRRIG 1000ML POUR BTL (IV SOLUTION) ×2 IMPLANT
PAD ARMBOARD 7.5X6 YLW CONV (MISCELLANEOUS) ×4 IMPLANT
PENCIL BUTTON HOLSTER BLD 10FT (ELECTRODE) ×2 IMPLANT
SHEARS HARMONIC 9CM CVD (BLADE) ×2 IMPLANT
SPONGE INTESTINAL PEANUT (DISPOSABLE) ×2 IMPLANT
STAPLER VISISTAT 35W (STAPLE) ×2 IMPLANT
SUT ETHILON 2 0 FS 18 (SUTURE) ×2 IMPLANT
SUT SILK 3 0 REEL (SUTURE) ×2 IMPLANT
SUT VIC AB 3-0 SH 27 (SUTURE) ×2
SUT VIC AB 3-0 SH 27X BRD (SUTURE) ×2 IMPLANT
SUT VICRYL 4-0 PS2 18IN ABS (SUTURE) ×4 IMPLANT
TOWEL OR 17X24 6PK STRL BLUE (TOWEL DISPOSABLE) ×2 IMPLANT
TRAY ENT MC OR (CUSTOM PROCEDURE TRAY) ×2 IMPLANT
TUBE FEEDING 10FR FLEXIFLO (MISCELLANEOUS) IMPLANT

## 2017-12-25 NOTE — Anesthesia Procedure Notes (Signed)
Procedure Name: Intubation Date/Time: 12/25/2017 9:57 AM Performed by: Marsa Aris, CRNA Pre-anesthesia Checklist: Patient identified, Emergency Drugs available, Suction available, Patient being monitored and Timeout performed Patient Re-evaluated:Patient Re-evaluated prior to induction Oxygen Delivery Method: Circle system utilized Preoxygenation: Pre-oxygenation with 100% oxygen Induction Type: IV induction Ventilation: Mask ventilation without difficulty Laryngoscope Size: Glidescope and 3 Grade View: Grade I Tube type: Oral (NIMs tube - position visualized with glidescope) Tube size: 7.0 mm Number of attempts: 1 Airway Equipment and Method: Stylet and Video-laryngoscopy Placement Confirmation: ETT inserted through vocal cords under direct vision,  positive ETCO2,  CO2 detector and breath sounds checked- equal and bilateral Secured at: 22 cm Tube secured with: Tape Dental Injury: Teeth and Oropharynx as per pre-operative assessment  Comments: Pt tolerated procedure well. C-spine neutrality maintained, dentition same as pre-op.

## 2017-12-25 NOTE — Brief Op Note (Signed)
12/25/2017  12:04 PM  PATIENT:  Grace [name]  58 y.o. female  PRE-OPERATIVE DIAGNOSIS:  Multi nodule goiter  POST-OPERATIVE DIAGNOSIS:  Multi nodule goiter  PROCEDURE:  Procedure(s): TOTAL THYROIDECTOMY (Bilateral)  SURGEON:  Surgeon(s) and Role:    Melida Quitter, MD - Primary  PHYSICIAN ASSISTANT: Sallee Provencal, PA  ASSISTANTS: none   ANESTHESIA:   general  EBL:  50 mL   BLOOD ADMINISTERED:none  DRAINS: (10 Fr) Jackson-Pratt drain(s) with closed bulb suction in the thyroid bed   LOCAL MEDICATIONS USED:  LIDOCAINE   SPECIMEN:  Source of Specimen:  Left and right thyroid lobes  DISPOSITION OF SPECIMEN:  PATHOLOGY  COUNTS:  YES  TOURNIQUET:  * No tourniquets in log *  DICTATION: .Other Dictation: Dictation Number [id number]  PLAN OF CARE: Admit for overnight observation  PATIENT DISPOSITION:  PACU - hemodynamically stable.   Delay start of Pharmacological VTE agent (>24hrs) due to surgical blood loss or risk of bleeding: yes

## 2017-12-25 NOTE — Op Note (Signed)
Grace [name], Grace [name] MEDICAL RECORD [id number] ACCOUNT [id number] DATE OF BIRTH:Mar 04, 1961 FACILITY: [facility] LOCATION: [facility] PHYSICIAN:Peder Allums D. Mikaeel Petrow, MD  OPERATIVE REPORT  DATE OF PROCEDURE:  12/25/2017  PREOPERATIVE DIAGNOSES: 1.  Multinodular goiter. 2.  Tracheal compression.  POSTOPERATIVE DIAGNOSES: 1.  Multinodular goiter. 2.  Tracheal compression.  PROCEDURE:  Total thyroidectomy.  SURGEON:  Melida Quitter, MD  ANESTHESIA:  General endotracheal anesthesia.  COMPLICATIONS:  None.  INDICATIONS:  The patient is a 57 year old female with a long history of a large multinodular goiter, more to the left side.  In recent months, she has noticed some difficulty breathing and has inspiratory stridor with deep inspiration.  Imaging  demonstrated a massive left-sided multinodular goiter also with nodules in the right side.  This was deviating the airway toward the right and compressing the trachea.  She presents to the operating room for surgical management.  FINDINGS:  The left thyroid lobe was quite large and fully replaced by abnormal nodular tissue.  The lobe extended up towards zone 2.  The right-side lobe was more normal in appearance with some firmness within it.  The left inferior parathyroid and  right superior parathyroids were identified during the case and kept safe.  Both recurrent nerves were identified during the case and kept safe.  DESCRIPTION OF PROCEDURE:  The patient was identified in the holding room, informed consent having been obtained including discussion of risks, benefits and alternatives.  The patient was brought to the operative suite and put on the operative table in  supine position.  Anesthesia was induced, and the patient was intubated by the anesthesia team without difficulty.  The patient was given intravenous antibiotics during the case.  The eyes were taped closed and a shoulder roll was placed.  The incision  was marked with a marking pen and  injected with 1% lidocaine with 1:100,000 epinephrine with the incision extending more toward the left.  The neck was prepped and draped in sterile fashion.  A nerve integrity monitor tube was used during the case and  was turned on at this point.  The neck incision was made with a 15-blade scalpel through the skin and extended through the subcutaneous and platysmal layer using Bovie electrocautery.  Subplatysmal flaps were elevated superiorly and inferiorly, and a  self-retaining retractor was added.  The midline raphae of the strap muscles was identified and was divided in a vertical fashion, and the left-sided strap muscles were retracted toward the left.  Dissection then began along the capsule of the large  thyroid lobe, and bleeding was controlled in places using bipolar electrocautery and/or ligation.  The lobe was quite large and exposure was difficult, so the left-sided strap muscles were divided using Bovie electrocautery over the lobe.  This allowed  better exposure.  Further dissection was then performed around the lobe with Bovie electrocautery and some with finger dissection.  The superior lobe was able to be retracted inferiorly and with continued dissection was able to be rotated out of the  wound somewhat.  The fascial connections and vessels were divided with Harmonic scalpel, and this was continued around the periphery of the gland, elevating it slowly.  Inferiorly, the same dissecting was performed around the periphery of the gland, and  the inferior parathyroid gland was identified and kept safe.  The gland was able to be elevated further out of the wound, and dissection was then performed along the deep extent of the gland.  The recurrent nerve was visualized and kept away from  the  dissection.  Dissection continued until the left lobe was pedicled on the thyroid isthmus, which was divided using Bovie electrocautery.  The lobe was passed to nursing for pathology.  The left side of the  wound was packed.  At this point, the right side  was exposed by elevating the strap muscles over the lobe.  The lobe was retracted medially and inferiorly.  Dissection was then performed around dividing vessels and fascial connections using the Harmonic scalpel.  The superior parathyroid gland was  dissected free at the superior pedicle region.  The dissection continued around the periphery of the gland, and it was able to be rotated medially.  Careful dissection was then performed around the deep surface of the gland, allowing visualization of the  recurrent nerve, which was kept intact and safe.  Continued dissection was performed to Berry's ligament, and the lobe was then able to be fully removed.  It was passed to nursing for pathology.  At this point, the wound was explored after removing the  packs on the left side.  The entire wound was copiously irrigated with saline and some areas controlled with bleeding by using bipolar electrocautery.  The strap muscles were then reconstituted on the left side using 3-0 Vicryl suture in a simple  interrupted fashion.  A 10-French round drain was placed in the depths of the wound after first placing Surgicel on each side of the wound.  The drain was sutured to the right neck using a 2-0 nylon suture in a standard drain stitch.  The midline raphae  was closed loosely with a couple of 3-0 Vicryl sutures.  The platysmal layer was then closed with 3-0 Vicryl suture in a simple running fashion.  The skin layer was closed in a subcutaneous layer using 4-0 Vicryl suture in a simple interrupted fashion  and then skin glue on the skin.  Drapes were removed, and the patient was cleaned off.  The drain was taped to the right shoulder.  Pressure was held against the neck during wakeup.  She was extubated in the recovery room in stable condition.  LN/NUANCE  D:12/25/2017 T:12/25/2017 JOB:[id number]

## 2017-12-25 NOTE — Anesthesia Preprocedure Evaluation (Addendum)
Anesthesia Evaluation  Patient identified by MRN, date of birth, ID band Patient awake    Reviewed: Allergy & Precautions, NPO status , Patient's Chart, lab work & pertinent test results  Airway Mallampati: II  TM Distance: >3 FB Neck ROM: Full    Dental  (+) Dental Advisory Given   Pulmonary neg pulmonary ROS,    breath sounds clear to auscultation       Cardiovascular hypertension, Pt. on medications  Rhythm:Regular Rate:Normal     Neuro/Psych negative neurological ROS     GI/Hepatic negative GI ROS, Neg liver ROS,   Endo/Other  Large multinodular goiter.  Renal/GU negative Renal ROS     Musculoskeletal   Abdominal   Peds  Hematology negative hematology ROS (+)   Anesthesia Other Findings   Reproductive/Obstetrics                            Lab Results  Component Value Date   WBC 5.7 12/17/2017   HGB 13.1 12/17/2017   HCT 41.1 12/17/2017   MCV 89.2 12/17/2017   PLT 349 12/17/2017   Lab Results  Component Value Date   CREATININE 0.95 12/17/2017   BUN 8 12/17/2017   NA 141 12/17/2017   K 3.2 (L) 12/17/2017   CL 104 12/17/2017   CO2 29 12/17/2017    Anesthesia Physical Anesthesia Plan  ASA: II  Anesthesia Plan: General   Post-op Pain Management:    Induction: Intravenous  PONV Risk Score and Plan: 3 and Ondansetron, Dexamethasone and Treatment may vary due to age or medical condition  Airway Management Planned: Oral ETT and Video Laryngoscope Planned  Additional Equipment:   Intra-op Plan:   Post-operative Plan: Extubation in OR  Informed Consent: I have reviewed the patients History and Physical, chart, labs and discussed the procedure including the risks, benefits and alternatives for the proposed anesthesia with the patient or authorized representative who has indicated his/her understanding and acceptance.   Dental advisory given  Plan Discussed with:  CRNA  Anesthesia Plan Comments:        Anesthesia Quick Evaluation

## 2017-12-25 NOTE — Anesthesia Postprocedure Evaluation (Signed)
Anesthesia Post Note  Patient: Grace [name]  Procedure(s) Performed: TOTAL THYROIDECTOMY (Bilateral Neck)     Patient location during evaluation: PACU Anesthesia Type: General Level of consciousness: awake and alert Pain management: pain level controlled Vital Signs Assessment: post-procedure vital signs reviewed and stable Respiratory status: spontaneous breathing, nonlabored ventilation, respiratory function stable and patient connected to nasal cannula oxygen Cardiovascular status: blood pressure returned to baseline and stable Postop Assessment: no apparent nausea or vomiting Anesthetic complications: no    Last Vitals:  Vitals:   12/25/17 1440 12/25/17 1500  BP:  (!) 148/102  Pulse:  92  Resp:  14  Temp: (!) 36.2 C 37.5 C  SpO2:  100%    Last Pain:  Vitals:   12/25/17 1500  TempSrc: Oral  PainSc:                  Demere Dotzler DAVID

## 2017-12-25 NOTE — H&P (Signed)
Grace [name] is an 57 y.o. female.   Chief Complaint: Thyroid goiter, inspiratory stridor HPI: 57 year old female with long history of massive thyroid goiter has had more difficulty with noisy inspiratory breathing.  Imaging demonstrates a very large left thyroid mass and smaller right-sided mass with narrowing of the tracheal and rightward deviation.  She presents for surgical management.  Past Medical History:  Diagnosis Date  . Goiter 12/23/2011   multi nodule  . HOH (hard of hearing)   . Hypertension   . Wears partial dentures     Past Surgical History:  Procedure Laterality Date  . CESAREAN SECTION    . CYST EXCISION     thigh  . MULTIPLE TOOTH EXTRACTIONS    . TUBAL LIGATION      Family History  Problem Relation Age of Onset  . Hypertension Mother   . Alzheimer's disease Mother    Social History:  reports that she has never smoked. She has never used smokeless tobacco. She reports that she does not drink alcohol or use drugs.  Allergies:  Allergies  Allergen Reactions  . Penicillins Other (See Comments)    Sisters are allergic Has patient had a PCN reaction causing immediate rash, facial/tongue/throat swelling, SOB or lightheadedness with hypotension: Unknown Has patient had a PCN reaction causing severe rash involving mucus membranes or skin necrosis: Unknown Has patient had a PCN reaction that required hospitalization: Unknown Has patient had a PCN reaction occurring within the last 10 years: Unknown If all of the above answers are "NO", then may proceed with Cephalosporin use.   . Amlodipine Swelling    Medications Prior to Admission  Medication Sig Dispense Refill  . lisinopril-hydrochlorothiazide (PRINZIDE,ZESTORETIC) 20-12.5 MG tablet TAKE 1 TABLET BY MOUTH EVERY DAY. 30 tablet 0  . amLODipine (NORVASC) 5 MG tablet Take 1 tablet (5 mg total) by mouth daily. (Patient not taking: Reported on 10/25/2015) 90 tablet 3  . metoprolol succinate (TOPROL-XL) 25 MG 24  hr tablet Take 1 tablet (25 mg total) by mouth daily. (Patient not taking: Reported on 10/30/2016) 90 tablet 3  . Multiple Vitamins-Minerals (MULTIVITAMIN PO) Take 1 tablet by mouth daily.      No results found for this or any previous visit (from the past 48 hour(s)). No results found.  Review of Systems  All other systems reviewed and are negative.   Blood pressure (!) 158/107, pulse 88, temperature 98.7 F (37.1 C), temperature source Oral, resp. rate 20, height 5' 5.5" (1.664 m), weight 180 lb (81.6 kg), last menstrual period 04/10/2012, SpO2 100 %. Physical Exam  Constitutional: She is oriented to person, place, and time. She appears well-developed and well-nourished. No distress.  HENT:  Head: Normocephalic and atraumatic.  Right Ear: External ear normal.  Left Ear: External ear normal.  Nose: Nose normal.  Mouth/Throat: Oropharynx is clear and moist.  Eyes: Pupils are equal, round, and reactive to light. Conjunctivae and EOM are normal.  Neck: Normal range of motion. Neck supple.  Bulky left-sided neck mass extends up into zone 2.  Cardiovascular: Normal rate.  Respiratory: Effort normal.  Musculoskeletal: Normal range of motion.  Neurological: She is alert and oriented to person, place, and time. No cranial nerve deficit.  Skin: Skin is warm and dry.  Psychiatric: She has a normal mood and affect. Her behavior is normal. Judgment and thought content normal.     Assessment/Plan Massive thyroid goiter with inspiratory stridor To OR for total thyroidectomy through incision extended to left.  Melida Quitter, MD 12/25/2017, 9:07 AM

## 2017-12-25 NOTE — Transfer of Care (Signed)
Immediate Anesthesia Transfer of Care Note  Patient: Grace [name]  Procedure(s) Performed: TOTAL THYROIDECTOMY (Bilateral Neck)  Patient Location: PACU  Anesthesia Type:General  Level of Consciousness: awake, alert  and oriented  Airway & Oxygen Therapy: Patient Spontanous Breathing and Patient connected to face mask oxygen  Post-op Assessment: Report given to RN and Post -op Vital signs reviewed and stable  Post vital signs: Reviewed and stable  Last Vitals:  Vitals Value Taken Time  BP 169/113 12/25/2017 12:26 PM  Temp 36.1 C 12/25/2017 12:26 PM  Pulse 113 12/25/2017 12:30 PM  Resp 20 12/25/2017 12:30 PM  SpO2 100 % 12/25/2017 12:30 PM  Vitals shown include unvalidated device data.  Last Pain:  Vitals:   12/25/17 0748  TempSrc:   PainSc: 0-No pain         Complications: No apparent anesthesia complications

## 2017-12-26 ENCOUNTER — Encounter (HOSPITAL_COMMUNITY): Payer: Self-pay | Admitting: Otolaryngology

## 2017-12-26 ENCOUNTER — Other Ambulatory Visit: Payer: Self-pay

## 2017-12-26 DIAGNOSIS — E042 Nontoxic multinodular goiter: Secondary | ICD-10-CM | POA: Diagnosis not present

## 2017-12-26 DIAGNOSIS — J398 Other specified diseases of upper respiratory tract: Secondary | ICD-10-CM | POA: Diagnosis not present

## 2017-12-26 DIAGNOSIS — I10 Essential (primary) hypertension: Secondary | ICD-10-CM | POA: Diagnosis not present

## 2017-12-26 LAB — CALCIUM
CALCIUM: 8.8 mg/dL — AB (ref 8.9–10.3)
Calcium: 9.2 mg/dL (ref 8.9–10.3)
Calcium: 9.3 mg/dL (ref 8.9–10.3)

## 2017-12-26 MED ORDER — ALUM & MAG HYDROXIDE-SIMETH 200-200-20 MG/5ML PO SUSP
30.0000 mL | ORAL | Status: DC | PRN
  Administered 2017-12-26: 30 mL via ORAL
  Filled 2017-12-26: qty 30

## 2017-12-26 NOTE — Progress Notes (Addendum)
1 Day Post-Op   Subjective/Chief Complaint: No complaints   Objective: Vital signs in last 24 hours: Temp:  [97 F (36.1 C)-100 F (37.8 C)] 98.6 F (37 C) (07/20 0314) Pulse Rate:  [80-113] 80 (07/20 0314) Resp:  [8-23] 20 (07/20 0314) BP: (113-175)/(90-115) 113/90 (07/20 0314) SpO2:  [93 %-100 %] 99 % (07/20 0314)    Intake/Output from previous day: 07/19 0701 - 07/20 0700 In: 1964.8 [I.V.:1964.8] Out: 90 [Drains:40; Blood:50] Intake/Output this shift: No intake/output data recorded.  voice strong. wound looks good. jp not holding charge but still with alot of drainage. no evidence of hematoma or infection  Lab Results:  No results for input(s): WBC, HGB, HCT, PLT in the last 72 hours. BMET Recent Labs    12/25/17 2111 12/26/17 0533  CALCIUM 8.9 9.2   PT/INR No results for input(s): LABPROT, INR in the last 72 hours. ABG No results for input(s): PHART, HCO3 in the last 72 hours.  Invalid input(s): PCO2, PO2  Studies/Results: No results found.  Anti-infectives: Anti-infectives (From admission, onward)   Start     Dose/Rate Route Frequency Ordered Stop   12/25/17 1800  clindamycin (CLEOCIN) IVPB 600 mg     600 mg 100 mL/hr over 30 Minutes Intravenous Every 8 hours 12/25/17 1459 12/26/17 2159   12/25/17 1015  clindamycin (CLEOCIN) IVPB 600 mg     600 mg 100 mL/hr over 30 Minutes Intravenous To Surgery 12/25/17 1003 12/25/17 1003      Assessment/Plan: s/p Procedure(s): TOTAL THYROIDECTOMY (Bilateral) she still has too much drainage to remove the drain. It is leaking around the drain. Will connect to suction and see I can seal it with DermaBond. After further discussion she prefers to just day and let the suction applied to the JP which was connected. I will see her in the morning and hopefully at that point the JP can be removed.  LOS: 0 days    Melissa Montane 12/26/2017

## 2017-12-27 ENCOUNTER — Encounter (HOSPITAL_COMMUNITY): Payer: Self-pay | Admitting: Internal Medicine

## 2017-12-27 DIAGNOSIS — I152 Hypertension secondary to endocrine disorders: Secondary | ICD-10-CM

## 2017-12-27 DIAGNOSIS — J398 Other specified diseases of upper respiratory tract: Secondary | ICD-10-CM | POA: Diagnosis not present

## 2017-12-27 DIAGNOSIS — I10 Essential (primary) hypertension: Secondary | ICD-10-CM | POA: Diagnosis not present

## 2017-12-27 DIAGNOSIS — E042 Nontoxic multinodular goiter: Secondary | ICD-10-CM | POA: Diagnosis not present

## 2017-12-27 LAB — CALCIUM: Calcium: 8.5 mg/dL — ABNORMAL LOW (ref 8.9–10.3)

## 2017-12-27 MED ORDER — METOPROLOL SUCCINATE ER 25 MG PO TB24
25.0000 mg | ORAL_TABLET | Freq: Every day | ORAL | Status: DC
  Administered 2017-12-27: 25 mg via ORAL
  Filled 2017-12-27: qty 1

## 2017-12-27 MED ORDER — CALCIUM CARBONATE ANTACID 500 MG PO CHEW: 1.0000 | CHEWABLE_TABLET | Freq: Three times a day (TID) | ORAL | Status: DC

## 2017-12-27 NOTE — Progress Notes (Signed)
Pt ready for DC per Dr. Janace Hoard. Medicine saw pt and started her on Toprol XL 25 mg daily. It was given prior to DC.  Pt informed that she needs to go get her new Rx for Toprol filled at the [address] [facility] and to start taking as prescribed.

## 2017-12-27 NOTE — Progress Notes (Signed)
2 Days Post-Op   Subjective/Chief Complaint: Her BP is up no cramping or tingling. Ca+ 8.5   Objective: Vital signs in last 24 hours: Temp:  [98.2 F (36.8 C)-98.6 F (37 C)] 98.2 F (36.8 C) (07/21 0334) Pulse Rate:  [84-97] 84 (07/21 0841) Resp:  [16] 16 (07/20 1107) BP: (144-173)/(104-118) 161/109 (07/21 0940) SpO2:  [98 %-100 %] 100 % (07/21 0631)    Intake/Output from previous day: 07/20 0701 - 07/21 0700 In: 1829.1 [P.O.:480; I.V.:1249.1; IV Piggyback:100] Out: 5 [Drains:5] Intake/Output this shift: No intake/output data recorded.  see discharge summary  Lab Results:  No results for input(s): WBC, HGB, HCT, PLT in the last 72 hours. BMET Recent Labs    12/26/17 2003 12/27/17 0417  CALCIUM 8.8* 8.5*   PT/INR No results for input(s): LABPROT, INR in the last 72 hours. ABG No results for input(s): PHART, HCO3 in the last 72 hours.  Invalid input(s): PCO2, PO2  Studies/Results: No results found.  Anti-infectives: Anti-infectives (From admission, onward)   Start     Dose/Rate Route Frequency Ordered Stop   12/25/17 1800  clindamycin (CLEOCIN) IVPB 600 mg     600 mg 100 mL/hr over 30 Minutes Intravenous Every 8 hours 12/25/17 1459 12/26/17 1615   12/25/17 1015  clindamycin (CLEOCIN) IVPB 600 mg     600 mg 100 mL/hr over 30 Minutes Intravenous To Surgery 12/25/17 1003 12/25/17 1003      Assessment/Plan: s/p Procedure(s): TOTAL THYROIDECTOMY (Bilateral) It is not exactly clear what blood pressure medication she has been on at home based on the chart but the patient states she only takes hydrochlorothiazide. I need to have a hospitalist come and render an opinion and recommendation on blood pressure medications and follow-up with her primary care doctor.  LOS: 0 days    Melissa Montane 12/27/2017

## 2017-12-27 NOTE — Progress Notes (Signed)
BP now 161/109 Right arm.

## 2017-12-27 NOTE — Progress Notes (Signed)
Dr. Janace Hoard notified of BP.

## 2017-12-27 NOTE — Discharge Summary (Signed)
Physician Discharge Summary  Patient ID: Grace [name] MRN: [id number] DOB/AGE: Jul 12, 1960 57 y.o.  Admit date: 12/25/2017 Discharge date: 12/27/2017  Admission Diagnoses:goiter  Discharge Diagnoses: same Active Problems:   Multinodular goiter   Discharged Condition: good  Hospital Course: admitted post total thyroidectomy. She is done well. The drain was removed without difficulty and had reasonable amount. Wound looks excellent. No evidence of infection. No cramping or tingling. She does have an increase in her blood pressure and hospitalists were called to suggest a additional medication to control that or make recommendations for follow-up and okay to discharge with pressure level. She will follow-up with Dr. Redmond Baseman in one week sooner if any issues with cramping tingling or any other problems or complaints.  Consults: none  Significant Diagnostic Studies: none  Treatments: surgery: total thyroidectomy  Discharge Exam: Blood pressure (!) 161/109, pulse 84, temperature 98.2 F (36.8 C), temperature source Oral, resp. rate 16, height 5' 5.5" (1.664 m), weight 81.6 kg (180 lb), last menstrual period 04/10/2012, SpO2 100 %. awake alert, wound loooks great. No infection. CV- regular l-normal effort. abd- soft ext-no tenderness or swelling  Disposition: Discharge disposition: 01-Home or Self Care       Discharge Instructions    Diet - low sodium heart healthy   Complete by:  As directed    Discharge instructions   Complete by:  As directed    Resume normal diet.  Avoid strenuous activity.  OK to allow incision to get wet, gently pat dry.  Do not apply antibiotic ointment to the incision.  Call with particularly bad swelling, fever, or wound redness or drainage.   Increase activity slowly   Complete by:  As directed       Follow-up Information    Melida Quitter, MD. Schedule an appointment as soon as possible for a visit on 01/04/2018.   Specialty:  Otolaryngology Contact  information: [address] [place] [address] [phone]           Signed: Melissa Montane 12/27/2017, 9:44 AM

## 2017-12-27 NOTE — Progress Notes (Signed)
Reviewed all DC instructions with pt and husband and copy given.  Pt informed to pick up Rx's at the [facility] [address] for the tums (3 times a day per Dr. Janace Hoard), thyroid med, and the new toprol.  Pt understands.  Pt is to call to the office on Monday am to make a follow up appointment to see Dr. Redmond Baseman.  JP is out by Dr.Byers and dressing over it is clean, dry and intact.   Pt instructed to call if has any s/s of low calcium.  Pt also instructed to follow up with her PCP about her BP.

## 2017-12-27 NOTE — Progress Notes (Signed)
Patient's blood pressure has been high through out he night, I paged the MD on call but no success. Patient was given her blood pressure medications early this am. Will continue to monitor patient,family at bedside. See flowsheet for blood pressure readings.

## 2017-12-27 NOTE — Consult Note (Signed)
[facility] Medical Consultation  Grace [name] EUM:[id number] DOB: December 15, 1960 DOA: 12/25/2017 PCP: Patient, No Pcp Per   Requesting physician: Janace Hoard Date of consultation: December 27 2017 Reason for consultation: htn  Impression/Recommendations Principal Problem:   Hypertension Active Problems:   Multinodular goiter  #1. Hypertension. Uncontrolled. History of same. Likely related to medication confusion. 2 years ago patient was on 3 agents hydrochlorothiazide, with Cipro, Norvasc. Norvasc was discontinued due to side effects with the intention of Toprol-XL being started. According to the patient this was never initiated. Chart review indicates she's only been to her primary care provider once since then that was a year ago. She is unsure of her blood pressures been controlled with just those 2 agents. Of note she takes a combination pill and was unaware that it was 2 agents she was taking. While in the hospital blood pressure range 170/110-190/90. She denies pain. She has been getting her was in a prone hydrochlorothiazide -We will add Toprol-XL 25 mg first dose to be given prior to discharge -Will call in prescription to her pharmacy -Have recommended to patient that she monitor her blood pressure once a day every day at the same time until her next appointment with PCP -Have recommended she call her primary care provider to arrange for an appointment within 1-2 weeks. Have instructed her to take blood pressure readings to that appointment -Have instructed patient to hold Toprol-XL if systolic blood pressure less than 110 and/or heart rate less than 55 -Have explained to patient that blood pressure control will likely require close management over time with PCP. She verbalized understanding   Thank you for letting us participate in the care of this patient.  Chief Complaint: HTN  HPI:  Patient is a very pleasant 57 year old with a past medical history that includes hypertension, hard  of hearing,multinodular goiter status post complete thyroidectomy 3 days ago. Blood pressure has been uncontrolled during her hospitalization. [facility] asked to consult  Information is obtained from the patient and the chart. Chart review indicates 2 years ago her antihypertensive medications include lisinopril and hydrochlorothiazide combo  and Norvasc. Chart indicates Norvasc was discontinued due to lower extremity edema and Toprol-XL 25 was started. Patient recalls having lower extremity edema and the Norvasc being discontinued but has no recollection of a different medication being prescribed. Since that time she has always been on the hydrochlorothiazide lisinopril combo. Of note she was unaware that the one pill she was taking was indeed a combination drug.  He states "I've never been told by blood pressure was not controlled". He has the apparatus at home to check her blood pressure but has not been doing so. She denies any headache dizziness visual disturbances numbness tingling of extremities.she denies chest pain palpitations shortness of breath. She denies abdominal pain nausea vomiting diarrhea constipation melena bright red blood per rectum. She denies dysuria hematuria frequency or urgency. She denies any difficulty swallowing she just reports that "I need to do it slowly".  Patient has been hypertensive during her entire stay in the hospital. She denies any preop or postop pain.  Review of Systems:  10 point review of systems complete and all systems are negative except as indicated in the history of present illness  Past Medical History:  Diagnosis Date  . Goiter 12/23/2011   multi nodule  . HOH (hard of hearing)   . Hypertension   . Wears partial dentures    Past Surgical History:  Procedure Laterality Date  . CESAREAN SECTION    .  CYST EXCISION     thigh  . MULTIPLE TOOTH EXTRACTIONS    . THYROIDECTOMY Bilateral 12/25/2017   Procedure: TOTAL THYROIDECTOMY;  Surgeon:  Melida Quitter, MD;  Location: [place];  Service: ENT;  Laterality: Bilateral;  . TUBAL LIGATION     Social History:  reports that she has never smoked. She has never used smokeless tobacco. She reports that she does not drink alcohol or use drugs.  Allergies  Allergen Reactions  . Penicillins Other (See Comments)    Sisters are allergic Has patient had a PCN reaction causing immediate rash, facial/tongue/throat swelling, SOB or lightheadedness with hypotension: Unknown Has patient had a PCN reaction causing severe rash involving mucus membranes or skin necrosis: Unknown Has patient had a PCN reaction that required hospitalization: Unknown Has patient had a PCN reaction occurring within the last 10 years: Unknown If all of the above answers are "NO", then may proceed with Cephalosporin use.   . Amlodipine Swelling   Family History  Problem Relation Age of Onset  . Hypertension Mother   . Alzheimer's disease Mother     Prior to Admission medications   Medication Sig Start Date End Date Taking? Authorizing Provider  lisinopril-hydrochlorothiazide (PRINZIDE,ZESTORETIC) 20-12.5 MG tablet TAKE 1 TABLET BY MOUTH EVERY DAY. 07/25/16  Yes Tereasa Coop, PA-C  amLODipine (NORVASC) 5 MG tablet Take 1 tablet (5 mg total) by mouth daily. Patient not taking: Reported on 10/25/2015 07/10/15   Darlyne Russian, MD  HYDROcodone-acetaminophen (NORCO/VICODIN) 5-325 MG tablet Take 1-2 tablets by mouth every 6 (six) hours as needed for moderate pain. 12/25/17 12/25/18  Melida Quitter, MD  levothyroxine (SYNTHROID) 150 MCG tablet Take 1 tablet (150 mcg total) by mouth daily. 12/25/17 12/25/18  Melida Quitter, MD  metoprolol succinate (TOPROL-XL) 25 MG 24 hr tablet Take 1 tablet (25 mg total) by mouth daily. Patient not taking: Reported on 10/30/2016 10/25/15   Darlyne Russian, MD  Multiple Vitamins-Minerals (MULTIVITAMIN PO) Take 1 tablet by mouth daily.    [provider]   Physical Exam: Blood pressure  (!) 161/109, pulse 84, temperature 98.2 F (36.8 C), temperature source Oral, resp. rate 16, height 5' 5.5" (1.664 m), weight 81.6 kg (180 lb), last menstrual period 04/10/2012, SpO2 100 %. Vitals:   12/27/17 0841 12/27/17 0940  BP: (!) 144/104 (!) 161/109  Pulse: 84   Resp:    Temp:    SpO2:       General:  Ambulating in room with steady gait well-nourished in no acute distress, she is quite hard of hearing  Eyes: pupils equal round reactive to light, EOMI, no scleral icterus  ENT: ears clear nose without drainage oropharynx without erythema or exudate.  Neck: supple no JVD full range of motion she does have a dressing to her surgical site at the base of her neck anteriorly. Dressing is dry  Cardiovascular: regular rate and rhythm no murmur gallop or rub no lower extremity edema  Respiratory: normal work of breathing breath sounds clear bilaterally I hear no rhonchi or crackles.  Abdomen: obese soft positive bowel sounds throughout no guarding or rebounding  Skin: no rashes or lesions. She has a incision at the base of her neck clean and dry  Musculoskeletal: joints without swelling/erythema  Psychiatric: calm cooperative responds appropriately "questions and commands  Neurologic: speech clear facial symmetry cranial nerves II through XII grossly intact  Labs on Admission:  Basic Metabolic Panel: Recent Labs  Lab 12/25/17 2111 12/26/17 0533 12/26/17 1216 12/26/17 2003  12/27/17 0417  CALCIUM 8.9 9.2 9.3 8.8* 8.5*   Liver Function Tests: No results for input(s): AST, ALT, ALKPHOS, BILITOT, PROT, ALBUMIN in the last 168 hours. No results for input(s): LIPASE, AMYLASE in the last 168 hours. No results for input(s): AMMONIA in the last 168 hours. CBC: No results for input(s): WBC, NEUTROABS, HGB, HCT, MCV, PLT in the last 168 hours. Cardiac Enzymes: No results for input(s): CKTOTAL, CKMB, CKMBINDEX, TROPONINI in the last 168 hours. BNP: Invalid input(s):  POCBNP CBG: No results for input(s): GLUCAP in the last 168 hours.  Radiological Exams on Admission: No results found.  EKG:   Time spent: 75 minutes  [facility]   If 7PM-7AM, please contact night-coverage [email or web address] Password [facility] 12/27/2017, 10:55 AM

## 2017-12-27 NOTE — Progress Notes (Signed)
BP is 157/112, pulse 90.  This is after pt had received her BP daily meds earlier this am.

## 2017-12-28 ENCOUNTER — Telehealth: Payer: Self-pay | Admitting: Emergency Medicine

## 2017-12-28 NOTE — Telephone Encounter (Signed)
Copied from [place] [id number]. Topic: Quick Communication - Rx Refill/Question >> Dec 28, 2017 12:00 PM [name], [place] C wrote: Medication: lisinopril-hydrochlorothiazide Grace [name]) 20-12.5   Has the patient contacted their pharmacy? No   (Agent: If no, request that the patient contact the pharmacy for the refill.) (Agent: If yes, when and what did the pharmacy advise?)  Preferred Pharmacy (with phone number or street name): [id number] [place], [place] - [address] [phone] (Phone) [phone] (Fax)      Agent: Please be advised that RX refills [name] take up to 3 business days. We ask that you follow-up with your pharmacy.

## 2017-12-29 NOTE — Telephone Encounter (Signed)
Last annual (LOV)  10/30/16  Did not return for 6 month follow up  Prescription expired  07/25/17 Lisinopril-hydrochlorothiazide 20-12.5 mg tab Last seen by Dr. Mitchel Honour

## 2017-12-30 ENCOUNTER — Other Ambulatory Visit: Payer: Self-pay | Admitting: *Deleted

## 2017-12-30 DIAGNOSIS — I10 Essential (primary) hypertension: Secondary | ICD-10-CM

## 2017-12-30 MED ORDER — LISINOPRIL-HYDROCHLOROTHIAZIDE 20-12.5 MG PO TABS: 1.0000 | ORAL_TABLET | Freq: Every day | ORAL | 0 refills | Status: DC

## 2017-12-30 NOTE — Telephone Encounter (Signed)
Patient was advised that 30 day supply will be sent in, she will need an office visit before this prescription runs out we will not be able to refill.     Patient voiced understanding

## 2018-01-16 ENCOUNTER — Ambulatory Visit: Payer: Commercial Managed Care - PPO | Admitting: Emergency Medicine

## 2018-01-22 ENCOUNTER — Ambulatory Visit: Payer: Commercial Managed Care - PPO | Admitting: Emergency Medicine

## 2018-01-22 ENCOUNTER — Encounter: Payer: Self-pay | Admitting: Emergency Medicine

## 2018-01-22 ENCOUNTER — Other Ambulatory Visit: Payer: Self-pay

## 2018-01-22 VITALS — BP 146/89 | HR 84 | Temp 98.3°F | Resp 16 | Ht 66.93 in | Wt 180.0 lb

## 2018-01-22 DIAGNOSIS — E079 Disorder of thyroid, unspecified: Secondary | ICD-10-CM

## 2018-01-22 DIAGNOSIS — I10 Essential (primary) hypertension: Secondary | ICD-10-CM | POA: Diagnosis not present

## 2018-01-22 MED ORDER — LEVOTHYROXINE SODIUM 150 MCG PO TABS: 150.0000 ug | ORAL_TABLET | Freq: Every day | ORAL | 3 refills | Status: DC

## 2018-01-22 MED ORDER — METOPROLOL SUCCINATE ER 25 MG PO TB24: 25.0000 mg | ORAL_TABLET | Freq: Every day | ORAL | 3 refills | Status: DC

## 2018-01-22 MED ORDER — LISINOPRIL-HYDROCHLOROTHIAZIDE 20-12.5 MG PO TABS: 1.0000 | ORAL_TABLET | Freq: Every day | ORAL | 3 refills | Status: DC

## 2018-01-22 NOTE — Patient Instructions (Addendum)
   If you have lab work done today you will be contacted with your lab results within the next 2 weeks.  If you have not heard from us then please contact us. The fastest way to get your results is to register for My Chart.   IF you received an x-ray today, you will receive an invoice from [facility]. Please contact [facility] at [phone] with questions or concerns regarding your invoice.   IF you received labwork today, you will receive an invoice from [facility]. Please contact [facility] at [phone] with questions or concerns regarding your invoice.   Our billing staff will not be able to assist you with questions regarding bills from these companies.  You will be contacted with the lab results as soon as they are available. The fastest way to get your results is to activate your My Chart account. Instructions are located on the last page of this paperwork. If you have not heard from us regarding the results in 2 weeks, please contact this office.     Hypertension Hypertension, commonly called high blood pressure, is when the force of blood pumping through the arteries is too strong. The arteries are the blood vessels that carry blood from the heart throughout the body. Hypertension forces the heart to work harder to pump blood and may cause arteries to become narrow or stiff. Having untreated or uncontrolled hypertension can cause heart attacks, strokes, kidney disease, and other problems. A blood pressure reading consists of a higher number over a lower number. Ideally, your blood pressure should be below 120/80. The first ("top") number is called the systolic pressure. It is a measure of the pressure in your arteries as your heart beats. The second ("bottom") number is called the diastolic pressure. It is a measure of the pressure in your arteries as the heart relaxes. What are the causes? The cause of this condition is not known. What increases the risk? Some  risk factors for high blood pressure are under your control. Others are not. Factors you can change  Smoking.  Having type 2 diabetes mellitus, high cholesterol, or both.  Not getting enough exercise or physical activity.  Being overweight.  Having too much fat, sugar, calories, or salt (sodium) in your diet.  Drinking too much alcohol. Factors that are difficult or impossible to change  Having chronic kidney disease.  Having a family history of high blood pressure.  Age. Risk increases with age.  Race. You may be at higher risk if you are African-American.  Gender. Men are at higher risk than women before age 45. After age 65, women are at higher risk than men.  Having obstructive sleep apnea.  Stress. What are the signs or symptoms? Extremely high blood pressure (hypertensive crisis) may cause:  Headache.  Anxiety.  Shortness of breath.  Nosebleed.  Nausea and vomiting.  Severe chest pain.  Jerky movements you cannot control (seizures).  How is this diagnosed? This condition is diagnosed by measuring your blood pressure while you are seated, with your arm resting on a surface. The cuff of the blood pressure monitor will be placed directly against the skin of your upper arm at the level of your heart. It should be measured at least twice using the same arm. Certain conditions can cause a difference in blood pressure between your right and left arms. Certain factors can cause blood pressure readings to be lower or higher than normal (elevated) for a short period of time:  When   your blood pressure is higher when you are in a health care provider's office than when you are at home, this is called white coat hypertension. Most people with this condition do not need medicines.  When your blood pressure is higher at home than when you are in a health care provider's office, this is called masked hypertension. Most people with this condition may need medicines to control  blood pressure.  If you have a high blood pressure reading during one visit or you have normal blood pressure with other risk factors:  You may be asked to return on a different day to have your blood pressure checked again.  You may be asked to monitor your blood pressure at home for 1 week or longer.  If you are diagnosed with hypertension, you may have other blood or imaging tests to help your health care provider understand your overall risk for other conditions. How is this treated? This condition is treated by making healthy lifestyle changes, such as eating healthy foods, exercising more, and reducing your alcohol intake. Your health care provider may prescribe medicine if lifestyle changes are not enough to get your blood pressure under control, and if:  Your systolic blood pressure is above 130.  Your diastolic blood pressure is above 80.  Your personal target blood pressure may vary depending on your medical conditions, your age, and other factors. Follow these instructions at home: Eating and drinking  Eat a diet that is high in fiber and potassium, and low in sodium, added sugar, and fat. An example eating plan is called the DASH (Dietary Approaches to Stop Hypertension) diet. To eat this way: ? Eat plenty of fresh fruits and vegetables. Try to fill half of your plate at each meal with fruits and vegetables. ? Eat whole grains, such as whole wheat pasta, brown rice, or whole grain bread. Fill about one quarter of your plate with whole grains. ? Eat or drink low-fat dairy products, such as skim milk or low-fat yogurt. ? Avoid fatty cuts of meat, processed or cured meats, and poultry with skin. Fill about one quarter of your plate with lean proteins, such as fish, chicken without skin, beans, eggs, and tofu. ? Avoid premade and processed foods. These tend to be higher in sodium, added sugar, and fat.  Reduce your daily sodium intake. Most people with hypertension should eat less  than 1,500 mg of sodium a day.  Limit alcohol intake to no more than 1 drink a day for nonpregnant women and 2 drinks a day for men. One drink equals 12 oz of beer, 5 oz of wine, or 1 oz of hard liquor. Lifestyle  Work with your health care provider to maintain a healthy body weight or to lose weight. Ask what an ideal weight is for you.  Get at least 30 minutes of exercise that causes your heart to beat faster (aerobic exercise) most days of the week. Activities may include walking, swimming, or biking.  Include exercise to strengthen your muscles (resistance exercise), such as pilates or lifting weights, as part of your weekly exercise routine. Try to do these types of exercises for 30 minutes at least 3 days a week.  Do not use any products that contain nicotine or tobacco, such as cigarettes and e-cigarettes. If you need help quitting, ask your health care provider.  Monitor your blood pressure at home as told by your health care provider.  Keep all follow-up visits as told by your health care provider.   This is important. Medicines  Take over-the-counter and prescription medicines only as told by your health care provider. Follow directions carefully. Blood pressure medicines must be taken as prescribed.  Do not skip doses of blood pressure medicine. Doing this puts you at risk for problems and can make the medicine less effective.  Ask your health care provider about side effects or reactions to medicines that you should watch for. Contact a health care provider if:  You think you are having a reaction to a medicine you are taking.  You have headaches that keep coming back (recurring).  You feel dizzy.  You have swelling in your ankles.  You have trouble with your vision. Get help right away if:  You develop a severe headache or confusion.  You have unusual weakness or numbness.  You feel faint.  You have severe pain in your chest or abdomen.  You vomit  repeatedly.  You have trouble breathing. Summary  Hypertension is when the force of blood pumping through your arteries is too strong. If this condition is not controlled, it may put you at risk for serious complications.  Your personal target blood pressure may vary depending on your medical conditions, your age, and other factors. For most people, a normal blood pressure is less than 120/80.  Hypertension is treated with lifestyle changes, medicines, or a combination of both. Lifestyle changes include weight loss, eating a healthy, low-sodium diet, exercising more, and limiting alcohol. This information is not intended to replace advice given to you by your health care provider. Make sure you discuss any questions you have with your health care provider. Document Released: 05/26/2005 Document Revised: 04/23/2016 Document Reviewed: 04/23/2016 Elsevier Interactive Patient Education  2018 [employer]  

## 2018-01-22 NOTE — Progress Notes (Signed)
Grace [name] 57 y.o.   Chief Complaint  Patient presents with  . Hypertension    follow-up   . Medication Refill    HISTORY OF PRESENT ILLNESS: This is a 57 y.o. female with history of hypertension here for follow-up and medication refill.  Doing well.  Has no complaints or medical concerns today.  HPI   Prior to Admission medications   Medication Sig Start Date End Date Taking? Authorizing Provider  levothyroxine (SYNTHROID) 150 MCG tablet Take 1 tablet (150 mcg total) by mouth daily. 12/25/17 12/25/18 Yes Melida Quitter, MD  lisinopril-hydrochlorothiazide (PRINZIDE,ZESTORETIC) 20-12.5 MG tablet Take 1 tablet by mouth daily. Office visit needed 12/30/17  Yes Kala Ambriz, Ines Bloomer, MD  metoprolol succinate (TOPROL-XL) 25 MG 24 hr tablet Take 25 mg by mouth daily. 12/27/17  Yes [provider]  Multiple Vitamins-Minerals (MULTIVITAMIN PO) Take 1 tablet by mouth daily.   Yes [provider]    Allergies  Allergen Reactions  . Penicillins Other (See Comments)    Sisters are allergic Has patient had a PCN reaction causing immediate rash, facial/tongue/throat swelling, SOB or lightheadedness with hypotension: Unknown Has patient had a PCN reaction causing severe rash involving mucus membranes or skin necrosis: Unknown Has patient had a PCN reaction that required hospitalization: Unknown Has patient had a PCN reaction occurring within the last 10 years: Unknown If all of the above answers are "NO", then may proceed with Cephalosporin use.   . Amlodipine Swelling    Patient Active Problem List   Diagnosis Date Noted  . Multinodular goiter 12/25/2017  . Hyperlipidemia 07/10/2015  . Hearing loss 07/10/2015  . Hypertension 12/23/2011  . Goiter 12/23/2011  . Fibroids 12/23/2011    Past Medical History:  Diagnosis Date  . Goiter 12/23/2011   multi nodule  . HOH (hard of hearing)   . Hypertension   . Wears partial dentures     Past Surgical History:    Procedure Laterality Date  . CESAREAN SECTION    . CYST EXCISION     thigh  . MULTIPLE TOOTH EXTRACTIONS    . THYROIDECTOMY Bilateral 12/25/2017   Procedure: TOTAL THYROIDECTOMY;  Surgeon: Melida Quitter, MD;  Location: [facility];  Service: ENT;  Laterality: Bilateral;  . TUBAL LIGATION      Social History   Socioeconomic History  . Marital status: Married    Spouse name: Not on file  . Number of children: Not on file  . Years of education: Not on file  . Highest education level: Not on file  Occupational History  . Not on file  Social Needs  . Financial resource strain: Not on file  . Food insecurity:    Worry: Not on file    Inability: Not on file  . Transportation needs:    Medical: Not on file    Non-medical: Not on file  Tobacco Use  . Smoking status: Never Smoker  . Smokeless tobacco: Never Used  Substance and Sexual Activity  . Alcohol use: No  . Drug use: No  . Sexual activity: Yes  Lifestyle  . Physical activity:    Days per week: Not on file    Minutes per session: Not on file  . Stress: Not on file  Relationships  . Social connections:    Talks on phone: Not on file    Gets together: Not on file    Attends religious service: Not on file    Active member of club or organization: Not on file  Attends meetings of clubs or organizations: Not on file    Relationship status: Not on file  . Intimate partner violence:    Fear of current or ex partner: Not on file    Emotionally abused: Not on file    Physically abused: Not on file    Forced sexual activity: Not on file  Other Topics Concern  . Not on file  Social History Narrative  . Not on file    Family History  Problem Relation Age of Onset  . Hypertension Mother   . Alzheimer's disease Mother      Review of Systems  Constitutional: Negative.  Negative for chills and fever.  HENT: Negative.  Negative for sore throat.   Eyes: Negative.  Negative for blurred vision and double vision.   Respiratory: Negative.  Negative for cough and shortness of breath.   Cardiovascular: Negative.  Negative for chest pain.  Gastrointestinal: Negative.  Negative for abdominal pain, diarrhea, nausea and vomiting.  Genitourinary: Negative.  Negative for dysuria.  Musculoskeletal: Negative.   Skin: Negative.  Negative for rash.  Neurological: Negative.  Negative for dizziness and headaches.  Endo/Heme/Allergies: Negative.   All other systems reviewed and are negative.   Vitals:   01/22/18 1605  BP: (!) 146/89  Pulse: 84  Resp: 16  Temp: 98.3 F (36.8 C)  SpO2: 99%    Physical Exam  Constitutional: She is oriented to person, place, and time. She appears well-developed and well-nourished.  HENT:  Head: Normocephalic and atraumatic.  Nose: Nose normal.  Mouth/Throat: Oropharynx is clear and moist.  Eyes: Pupils are equal, round, and reactive to light. Conjunctivae and EOM are normal.  Neck: Normal range of motion. Neck supple.  Cardiovascular: Normal rate, regular rhythm and normal heart sounds.  Pulmonary/Chest: Effort normal and breath sounds normal.  Abdominal: Soft. There is no tenderness.  Musculoskeletal: Normal range of motion.  Neurological: She is alert and oriented to person, place, and time. No sensory deficit. She exhibits normal muscle tone.  Skin: Skin is warm and dry. Capillary refill takes less than 2 seconds.  Psychiatric: She has a normal mood and affect. Her behavior is normal.  Vitals reviewed.  A total of 25 minutes was spent in the room with the patient, greater than 50% of which was in counseling/coordination of care regarding hypertension, management, medications, nutrition, and need for follow-up.   ASSESSMENT & PLAN: Fey was seen today for hypertension and medication refill.  Diagnoses and all orders for this visit:  Essential hypertension -     metoprolol succinate (TOPROL-XL) 25 MG 24 hr tablet; Take 1 tablet (25 mg total) by mouth daily. -      lisinopril-hydrochlorothiazide (PRINZIDE,ZESTORETIC) 20-12.5 MG tablet; Take 1 tablet by mouth daily. Office visit needed  Thyroid disease -     levothyroxine (SYNTHROID) 150 MCG tablet; Take 1 tablet (150 mcg total) by mouth daily.    Patient Instructions       If you have lab work done today you will be contacted with your lab results within the next 2 weeks.  If you have not heard from [place] then please contact us. The fastest way to get your results is to register for My Chart.   IF you received an x-ray today, you will receive an invoice from [facility]. Please contact [facility] at [phone] with questions or concerns regarding your invoice.   IF you received labwork today, you will receive an invoice from [facility]. Please contact [facility] at  [phone] with questions or concerns regarding your invoice.   Our billing staff will not be able to assist you with questions regarding bills from these companies.  You will be contacted with the lab results as soon as they are available. The fastest way to get your results is to activate your My Chart account. Instructions are located on the last page of this paperwork. If you have not heard from [place] regarding the results in 2 weeks, please contact this office.      Hypertension Hypertension, commonly called high blood pressure, is when the force of blood pumping through the arteries is too strong. The arteries are the blood vessels that carry blood from the heart throughout the body. Hypertension forces the heart to work harder to pump blood and may cause arteries to become narrow or stiff. Having untreated or uncontrolled hypertension can cause heart attacks, strokes, kidney disease, and other problems. A blood pressure reading consists of a higher number over a lower number. Ideally, your blood pressure should be below 120/80. The first ("top") number is called the systolic pressure. It is a measure of the pressure  in your arteries as your heart beats. The second ("bottom") number is called the diastolic pressure. It is a measure of the pressure in your arteries as the heart relaxes. What are the causes? The cause of this condition is not known. What increases the risk? Some risk factors for high blood pressure are under your control. Others are not. Factors you can change  Smoking.  Having type 2 diabetes mellitus, high cholesterol, or both.  Not getting enough exercise or physical activity.  Being overweight.  Having too much fat, sugar, calories, or salt (sodium) in your diet.  Drinking too much alcohol. Factors that are difficult or impossible to change  Having chronic kidney disease.  Having a family history of high blood pressure.  Age. Risk increases with age.  Race. You may be at higher risk if you are African-American.  Gender. Men are at higher risk than women before age 53. After age 92, women are at higher risk than men.  Having obstructive sleep apnea.  Stress. What are the signs or symptoms? Extremely high blood pressure (hypertensive crisis) may cause:  Headache.  Anxiety.  Shortness of breath.  Nosebleed.  Nausea and vomiting.  Severe chest pain.  Jerky movements you cannot control (seizures).  How is this diagnosed? This condition is diagnosed by measuring your blood pressure while you are seated, with your arm resting on a surface. The cuff of the blood pressure monitor will be placed directly against the skin of your upper arm at the level of your heart. It should be measured at least twice using the same arm. Certain conditions can cause a difference in blood pressure between your right and left arms. Certain factors can cause blood pressure readings to be lower or higher than normal (elevated) for a short period of time:  When your blood pressure is higher when you are in a health care provider's office than when you are at home, this is called white  coat hypertension. Most people with this condition do not need medicines.  When your blood pressure is higher at home than when you are in a health care provider's office, this is called masked hypertension. Most people with this condition may need medicines to control blood pressure.  If you have a high blood pressure reading during one visit or you have normal blood pressure with other risk factors:  You may be asked to return on a different day to have your blood pressure checked again.  You may be asked to monitor your blood pressure at home for 1 week or longer.  If you are diagnosed with hypertension, you may have other blood or imaging tests to help your health care provider understand your overall risk for other conditions. How is this treated? This condition is treated by making healthy lifestyle changes, such as eating healthy foods, exercising more, and reducing your alcohol intake. Your health care provider may prescribe medicine if lifestyle changes are not enough to get your blood pressure under control, and if:  Your systolic blood pressure is above 130.  Your diastolic blood pressure is above 80.  Your personal target blood pressure may vary depending on your medical conditions, your age, and other factors. Follow these instructions at home: Eating and drinking  Eat a diet that is high in fiber and potassium, and low in sodium, added sugar, and fat. An example eating plan is called the DASH (Dietary Approaches to Stop Hypertension) diet. To eat this way: ? Eat plenty of fresh fruits and vegetables. Try to fill half of your plate at each meal with fruits and vegetables. ? Eat whole grains, such as whole wheat pasta, brown rice, or whole grain bread. Fill about one quarter of your plate with whole grains. ? Eat or drink low-fat dairy products, such as skim milk or low-fat yogurt. ? Avoid fatty cuts of meat, processed or cured meats, and poultry with skin. Fill about one  quarter of your plate with lean proteins, such as fish, chicken without skin, beans, eggs, and tofu. ? Avoid premade and processed foods. These tend to be higher in sodium, added sugar, and fat.  Reduce your daily sodium intake. Most people with hypertension should eat less than 1,500 mg of sodium a day.  Limit alcohol intake to no more than 1 drink a day for nonpregnant women and 2 drinks a day for men. One drink equals 12 oz of beer, 5 oz of wine, or 1 oz of hard liquor. Lifestyle  Work with your health care provider to maintain a healthy body weight or to lose weight. Ask what an ideal weight is for you.  Get at least 30 minutes of exercise that causes your heart to beat faster (aerobic exercise) most days of the week. Activities may include walking, swimming, or biking.  Include exercise to strengthen your muscles (resistance exercise), such as pilates or lifting weights, as part of your weekly exercise routine. Try to do these types of exercises for 30 minutes at least 3 days a week.  Do not use any products that contain nicotine or tobacco, such as cigarettes and e-cigarettes. If you need help quitting, ask your health care provider.  Monitor your blood pressure at home as told by your health care provider.  Keep all follow-up visits as told by your health care provider. This is important. Medicines  Take over-the-counter and prescription medicines only as told by your health care provider. Follow directions carefully. Blood pressure medicines must be taken as prescribed.  Do not skip doses of blood pressure medicine. Doing this puts you at risk for problems and can make the medicine less effective.  Ask your health care provider about side effects or reactions to medicines that you should watch for. Contact a health care provider if:  You think you are having a reaction to a medicine you are taking.  You have headaches  that keep coming back (recurring).  You feel dizzy.  You  have swelling in your ankles.  You have trouble with your vision. Get help right away if:  You develop a severe headache or confusion.  You have unusual weakness or numbness.  You feel faint.  You have severe pain in your chest or abdomen.  You vomit repeatedly.  You have trouble breathing. Summary  Hypertension is when the force of blood pumping through your arteries is too strong. If this condition is not controlled, it may put you at risk for serious complications.  Your personal target blood pressure may vary depending on your medical conditions, your age, and other factors. For most people, a normal blood pressure is less than 120/80.  Hypertension is treated with lifestyle changes, medicines, or a combination of both. Lifestyle changes include weight loss, eating a healthy, low-sodium diet, exercising more, and limiting alcohol. This information is not intended to replace advice given to you by your health care provider. Make sure you discuss any questions you have with your health care provider. Document Released: 05/26/2005 Document Revised: 04/23/2016 Document Reviewed: 04/23/2016 Elsevier Interactive Patient Education  2018 [employer]      Agustina Caroli, MD [facility]

## 2018-01-22 NOTE — Addendum Note (Signed)
Addended by: Davina Poke on: 01/22/2018 04:34 PM   Modules accepted: Orders

## 2018-02-19 DIAGNOSIS — L03221 Cellulitis of neck: Secondary | ICD-10-CM | POA: Diagnosis not present

## 2018-02-19 DIAGNOSIS — Z9089 Acquired absence of other organs: Secondary | ICD-10-CM | POA: Diagnosis not present

## 2018-02-26 DIAGNOSIS — E890 Postprocedural hypothyroidism: Secondary | ICD-10-CM | POA: Diagnosis not present

## 2018-02-26 DIAGNOSIS — L03221 Cellulitis of neck: Secondary | ICD-10-CM | POA: Diagnosis not present

## 2018-10-14 ENCOUNTER — Other Ambulatory Visit: Payer: Self-pay | Admitting: *Deleted

## 2018-10-14 ENCOUNTER — Telehealth: Payer: Self-pay | Admitting: Emergency Medicine

## 2018-10-14 DIAGNOSIS — I10 Essential (primary) hypertension: Secondary | ICD-10-CM

## 2018-10-14 MED ORDER — LISINOPRIL-HYDROCHLOROTHIAZIDE 20-12.5 MG PO TABS: 1.0000 | ORAL_TABLET | Freq: Every day | ORAL | 0 refills | Status: DC

## 2018-10-14 NOTE — Telephone Encounter (Signed)
Pt  Has a CPE scheduled and is wanting refill in lisinopril-hydrochlorothiazide (PRINZIDE,ZESTORETIC) 20-12.5 MG tablet  Can you call in couretesy refill for Her FR

## 2018-10-18 ENCOUNTER — Ambulatory Visit: Payer: Commercial Managed Care - PPO | Admitting: Emergency Medicine

## 2018-10-26 ENCOUNTER — Other Ambulatory Visit: Payer: Self-pay | Admitting: *Deleted

## 2018-10-26 DIAGNOSIS — I10 Essential (primary) hypertension: Secondary | ICD-10-CM

## 2018-10-26 DIAGNOSIS — Z13220 Encounter for screening for lipoid disorders: Secondary | ICD-10-CM

## 2018-10-26 DIAGNOSIS — Z131 Encounter for screening for diabetes mellitus: Secondary | ICD-10-CM

## 2018-10-26 DIAGNOSIS — E079 Disorder of thyroid, unspecified: Secondary | ICD-10-CM

## 2018-10-28 ENCOUNTER — Encounter: Payer: Commercial Managed Care - PPO | Admitting: Emergency Medicine

## 2018-11-04 ENCOUNTER — Other Ambulatory Visit: Payer: Self-pay

## 2018-11-04 ENCOUNTER — Ambulatory Visit: Payer: Commercial Managed Care - PPO | Admitting: Emergency Medicine

## 2018-11-04 ENCOUNTER — Encounter: Payer: Self-pay | Admitting: Emergency Medicine

## 2018-11-04 VITALS — BP 130/87 | HR 79 | Temp 97.7°F | Resp 14 | Wt 182.6 lb

## 2018-11-04 DIAGNOSIS — Z0001 Encounter for general adult medical examination with abnormal findings: Secondary | ICD-10-CM

## 2018-11-04 DIAGNOSIS — I10 Essential (primary) hypertension: Secondary | ICD-10-CM

## 2018-11-04 DIAGNOSIS — Z0000 Encounter for general adult medical examination without abnormal findings: Secondary | ICD-10-CM

## 2018-11-04 DIAGNOSIS — Z13220 Encounter for screening for lipoid disorders: Secondary | ICD-10-CM

## 2018-11-04 DIAGNOSIS — E079 Disorder of thyroid, unspecified: Secondary | ICD-10-CM | POA: Diagnosis not present

## 2018-11-04 DIAGNOSIS — Z131 Encounter for screening for diabetes mellitus: Secondary | ICD-10-CM

## 2018-11-04 DIAGNOSIS — Z1211 Encounter for screening for malignant neoplasm of colon: Secondary | ICD-10-CM

## 2018-11-04 NOTE — Patient Instructions (Addendum)
   If you have lab work done today you will be contacted with your lab results within the next 2 weeks.  If you have not heard from us then please contact us. The fastest way to get your results is to register for My Chart.   IF you received an x-ray today, you will receive an invoice from [facility]. Please contact [facility] at [phone] with questions or concerns regarding your invoice.   IF you received labwork today, you will receive an invoice from [facility]. Please contact [facility] at [phone] with questions or concerns regarding your invoice.   Our billing staff will not be able to assist you with questions regarding bills from these companies.  You will be contacted with the lab results as soon as they are available. The fastest way to get your results is to activate your My Chart account. Instructions are located on the last page of this paperwork. If you have not heard from us regarding the results in 2 weeks, please contact this office.    Health Maintenance, Female Adopting a healthy lifestyle and getting preventive care can go a long way to promote health and wellness. Talk with your health care provider about what schedule of regular examinations is right for you. This is a good chance for you to check in with your provider about disease prevention and staying healthy. In between checkups, there are plenty of things you can do on your own. Experts have done a lot of research about which lifestyle changes and preventive measures are most likely to keep you healthy. Ask your health care provider for more information. Weight and diet Eat a healthy diet  Be sure to include plenty of vegetables, fruits, low-fat dairy products, and lean protein.  Do not eat a lot of foods high in solid fats, added sugars, or salt.  Get regular exercise. This is one of the most important things you can do for your health. ? Most adults should exercise for at least 150  minutes each week. The exercise should increase your heart rate and make you sweat (moderate-intensity exercise). ? Most adults should also do strengthening exercises at least twice a week. This is in addition to the moderate-intensity exercise. Maintain a healthy weight  Body mass index (BMI) is a measurement that can be used to identify possible weight problems. It estimates body fat based on height and weight. Your health care provider can help determine your BMI and help you achieve or maintain a healthy weight.  For females 20 years of age and older: ? A BMI below 18.5 is considered underweight. ? A BMI of 18.5 to 24.9 is normal. ? A BMI of 25 to 29.9 is considered overweight. ? A BMI of 30 and above is considered obese. Watch levels of cholesterol and blood lipids  You should start having your blood tested for lipids and cholesterol at 59 years of age, then have this test every 5 years.  You may need to have your cholesterol levels checked more often if: ? Your lipid or cholesterol levels are high. ? You are older than 59 years of age. ? You are at high risk for heart disease. Cancer screening Lung Cancer  Lung cancer screening is recommended for adults 55-80 years old who are at high risk for lung cancer because of a history of smoking.  A yearly low-dose CT scan of the lungs is recommended for people who: ? Currently smoke. ? Have quit within the past 15   past 15 years. ? Have at least a 30-pack-year history of smoking. A pack year is smoking an average of one pack of cigarettes a day for 1 year.  Yearly screening should continue until it has been 15 years since you quit.  Yearly screening should stop if you develop a health problem that would prevent you from having lung cancer treatment. Breast Cancer  Practice breast self-awareness. This means understanding how your breasts normally appear and feel.  It also means doing regular breast self-exams. Let your health care provider  know about any changes, no matter how small.  If you are in your 20s or 30s, you should have a clinical breast exam (CBE) by a health care provider every 1-3 years as part of a regular health exam.  If you are 45 or older, have a CBE every year. Also consider having a breast X-ray (mammogram) every year.  If you have a family history of breast cancer, talk to your health care provider about genetic screening.  If you are at high risk for breast cancer, talk to your health care provider about having an MRI and a mammogram every year.  Breast cancer gene (BRCA) assessment is recommended for women who have family members with BRCA-related cancers. BRCA-related cancers include: ? Breast. ? Ovarian. ? Tubal. ? Peritoneal cancers.  Results of the assessment will determine the need for genetic counseling and BRCA1 and BRCA2 testing. Cervical Cancer Your health care provider may recommend that you be screened regularly for cancer of the pelvic organs (ovaries, uterus, and vagina). This screening involves a pelvic examination, including checking for microscopic changes to the surface of your cervix (Pap test). You may be encouraged to have this screening done every 3 years, beginning at age 56.  For women ages 26-65, health care providers may recommend pelvic exams and Pap testing every 3 years, or they may recommend the Pap and pelvic exam, combined with testing for human papilloma virus (HPV), every 5 years. Some types of HPV increase your risk of cervical cancer. Testing for HPV may also be done on women of any age with unclear Pap test results.  Other health care providers may not recommend any screening for nonpregnant women who are considered low risk for pelvic cancer and who do not have symptoms. Ask your health care provider if a screening pelvic exam is right for you.  If you have had past treatment for cervical cancer or a condition that could lead to cancer, you need Pap tests and  screening for cancer for at least 20 years after your treatment. If Pap tests have been discontinued, your risk factors (such as having a new sexual partner) need to be reassessed to determine if screening should resume. Some women have medical problems that increase the chance of getting cervical cancer. In these cases, your health care provider may recommend more frequent screening and Pap tests. Colorectal Cancer  This type of cancer can be detected and often prevented.  Routine colorectal cancer screening usually begins at 59 years of age and continues through 59 years of age.  Your health care provider may recommend screening at an earlier age if you have risk factors for colon cancer.  Your health care provider may also recommend using home test kits to check for hidden blood in the stool.  A small camera at the end of a tube can be used to examine your colon directly (sigmoidoscopy or colonoscopy). This is done to check for the earliest forms of  Routine screening usually begins at age 50.  Direct examination of the colon should be repeated every 5-10 years through 59 years of age. However, you may need to be screened more often if early forms of precancerous polyps or small growths are found. Skin Cancer  Check your skin from head to toe regularly.  Tell your health care provider about any new moles or changes in moles, especially if there is a change in a mole's shape or color.  Also tell your health care provider if you have a mole that is larger than the size of a pencil eraser.  Always use sunscreen. Apply sunscreen liberally and repeatedly throughout the day.  Protect yourself by wearing long sleeves, pants, a wide-brimmed hat, and sunglasses whenever you are outside. Heart disease, diabetes, and high blood pressure  High blood pressure causes heart disease and increases the risk of stroke. High blood pressure is more likely to develop in: ? People who  have blood pressure in the high end of the normal range (130-139/85-89 mm Hg). ? People who are overweight or obese. ? People who are African American.  If you are 18-39 years of age, have your blood pressure checked every 3-5 years. If you are 40 years of age or older, have your blood pressure checked every year. You should have your blood pressure measured twice-once when you are at a hospital or clinic, and once when you are not at a hospital or clinic. Record the average of the two measurements. To check your blood pressure when you are not at a hospital or clinic, you can use: ? An automated blood pressure machine at a pharmacy. ? A home blood pressure monitor.  If you are between 55 years and 79 years old, ask your health care provider if you should take aspirin to prevent strokes.  Have regular diabetes screenings. This involves taking a blood sample to check your fasting blood sugar level. ? If you are at a normal weight and have a low risk for diabetes, have this test once every three years after 59 years of age. ? If you are overweight and have a high risk for diabetes, consider being tested at a younger age or more often. Preventing infection Hepatitis B  If you have a higher risk for hepatitis B, you should be screened for this virus. You are considered at high risk for hepatitis B if: ? You were born in a country where hepatitis B is common. Ask your health care provider which countries are considered high risk. ? Your parents were born in a high-risk country, and you have not been immunized against hepatitis B (hepatitis B vaccine). ? You have HIV or AIDS. ? You use needles to inject street drugs. ? You live with someone who has hepatitis B. ? You have had sex with someone who has hepatitis B. ? You get hemodialysis treatment. ? You take certain medicines for conditions, including cancer, organ transplantation, and autoimmune conditions. Hepatitis C  Blood testing is  recommended for: ? Everyone born from 1945 through 1965. ? Anyone with known risk factors for hepatitis C. Sexually transmitted infections (STIs)  You should be screened for sexually transmitted infections (STIs) including gonorrhea and chlamydia if: ? You are sexually active and are younger than 59 years of age. ? You are older than 59 years of age and your health care provider tells you that you are at risk for this type of infection. ? Your sexual activity has changed since you   were last screened and you are at an increased risk for chlamydia or gonorrhea. Ask your health care provider if you are at risk.  If you do not have HIV, but are at risk, it may be recommended that you take a prescription medicine daily to prevent HIV infection. This is called pre-exposure prophylaxis (PrEP). You are considered at risk if: ? You are sexually active and do not regularly use condoms or know the HIV status of your partner(s). ? You take drugs by injection. ? You are sexually active with a partner who has HIV. Talk with your health care provider about whether you are at high risk of being infected with HIV. If you choose to begin PrEP, you should first be tested for HIV. You should then be tested every 3 months for as long as you are taking PrEP. Pregnancy  If you are premenopausal and you may become pregnant, ask your health care provider about preconception counseling.  If you may become pregnant, take 400 to 800 micrograms (mcg) of folic acid every day.  If you want to prevent pregnancy, talk to your health care provider about birth control (contraception). Osteoporosis and menopause  Osteoporosis is a disease in which the bones lose minerals and strength with aging. This can result in serious bone fractures. Your risk for osteoporosis can be identified using a bone density scan.  If you are 65 years of age or older, or if you are at risk for osteoporosis and fractures, ask your health care  provider if you should be screened.  Ask your health care provider whether you should take a calcium or vitamin D supplement to lower your risk for osteoporosis.  Menopause may have certain physical symptoms and risks.  Hormone replacement therapy may reduce some of these symptoms and risks. Talk to your health care provider about whether hormone replacement therapy is right for you. Follow these instructions at home:  Schedule regular health, dental, and eye exams.  Stay current with your immunizations.  Do not use any tobacco products including cigarettes, chewing tobacco, or electronic cigarettes.  If you are pregnant, do not drink alcohol.  If you are breastfeeding, limit how much and how often you drink alcohol.  Limit alcohol intake to no more than 1 drink per day for nonpregnant women. One drink equals 12 ounces of beer, 5 ounces of wine, or 1 ounces of hard liquor.  Do not use street drugs.  Do not share needles.  Ask your health care provider for help if you need support or information about quitting drugs.  Tell your health care provider if you often feel depressed.  Tell your health care provider if you have ever been abused or do not feel safe at home. This information is not intended to replace advice given to you by your health care provider. Make sure you discuss any questions you have with your health care provider. Document Released: 12/09/2010 Document Revised: 11/01/2015 Document Reviewed: 02/27/2015 [employer] Interactive Patient Education  2019 [employer]  

## 2018-11-04 NOTE — Progress Notes (Signed)
BP Readings from Last 3 Encounters:  01/22/18 (!) 146/89  12/27/17 (!) 161/109  12/17/17 134/85   Lab Results  Component Value Date   CREATININE 0.95 12/17/2017   BUN 8 12/17/2017   NA 141 12/17/2017   K 3.2 (L) 12/17/2017   CL 104 12/17/2017   CO2 29 12/17/2017   Wt Readings from Last 3 Encounters:  01/22/18 180 lb (81.6 kg)  12/25/17 180 lb (81.6 kg)  12/17/17 182 lb 4 oz (82.7 kg)   The 10-year ASCVD risk score Mikey Bussing DC Jr., et al., 2013) is: 9.2%   Values used to calculate the score:     Age: 12 years     Sex: Female     Is Non-Hispanic African American: Yes     Diabetic: No     Tobacco smoker: No     Systolic Blood Pressure: 149 mmHg     Is BP treated: Yes     HDL Cholesterol: 60 mg/dL     Total Cholesterol: 220 mg/dL Lab Results  Component Value Date   CHOL 220 (H) 10/30/2016   HDL 60 10/30/2016   LDLCALC 149 (H) 10/30/2016   TRIG 55 10/30/2016   CHOLHDL 3.7 10/30/2016   Coral Sivak 58 y.o.   Chief Complaint  Patient presents with  . Annual Exam    Not having any issus at this time just here for a physical that I usually have done for my job yearly    HISTORY OF PRESENT ILLNESS: This is a 58 y.o. female here for annual exam.  Has no complaints or medical concerns today. Has a history of hypertension, presently taking Zestoretic and Toprol-XL. Had thyroid surgery last year.  Doing well. Scheduled soon for mammogram and Pap smear with her OB/GYN doctor. Has not had a colonoscopy yet.  HPI   Prior to Admission medications   Medication Sig Start Date End Date Taking? Authorizing Provider  levothyroxine (SYNTHROID) 150 MCG tablet Take 1 tablet (150 mcg total) by mouth daily. 01/22/18 01/22/19 Yes Zyron Deeley, Ines Bloomer, MD  lisinopril-hydrochlorothiazide (ZESTORETIC) 20-12.5 MG tablet Take 1 tablet by mouth daily. Office visit needed 10/14/18 01/12/19 Yes Hilario Robarts, Ines Bloomer, MD  Multiple Vitamins-Minerals (MULTIVITAMIN PO) Take 1 tablet by mouth daily.    Yes [provider]  metoprolol succinate (TOPROL-XL) 25 MG 24 hr tablet Take 1 tablet (25 mg total) by mouth daily. 01/22/18 04/22/18  Horald Pollen, MD    Allergies  Allergen Reactions  . Penicillins Other (See Comments)    Sisters are allergic Has patient had a PCN reaction causing immediate rash, facial/tongue/throat swelling, SOB or lightheadedness with hypotension: Unknown Has patient had a PCN reaction causing severe rash involving mucus membranes or skin necrosis: Unknown Has patient had a PCN reaction that required hospitalization: Unknown Has patient had a PCN reaction occurring within the last 10 years: Unknown If all of the above answers are "NO", then may proceed with Cephalosporin use.   . Amlodipine Swelling    Patient Active Problem List   Diagnosis Date Noted  . Multinodular goiter 12/25/2017  . Hyperlipidemia 07/10/2015  . Hearing loss 07/10/2015  . Hypertension 12/23/2011  . Goiter 12/23/2011  . Fibroids 12/23/2011    Past Medical History:  Diagnosis Date  . Goiter 12/23/2011   multi nodule  . HOH (hard of hearing)   . Hypertension   . Wears partial dentures     Past Surgical History:  Procedure Laterality Date  . CESAREAN SECTION    .  CYST EXCISION     thigh  . MULTIPLE TOOTH EXTRACTIONS    . THYROIDECTOMY Bilateral 12/25/2017   Procedure: TOTAL THYROIDECTOMY;  Surgeon: Melida Quitter, MD;  Location: [facility];  Service: ENT;  Laterality: Bilateral;  . TUBAL LIGATION      Social History   Socioeconomic History  . Marital status: Married    Spouse name: Not on file  . Number of children: Not on file  . Years of education: Not on file  . Highest education level: Not on file  Occupational History  . Not on file  Social Needs  . Financial resource strain: Not on file  . Food insecurity:    Worry: Not on file    Inability: Not on file  . Transportation needs:    Medical: Not on file    Non-medical: Not on file  Tobacco Use  .  Smoking status: Never Smoker  . Smokeless tobacco: Never Used  Substance and Sexual Activity  . Alcohol use: No  . Drug use: No  . Sexual activity: Yes  Lifestyle  . Physical activity:    Days per week: Not on file    Minutes per session: Not on file  . Stress: Not on file  Relationships  . Social connections:    Talks on phone: Not on file    Gets together: Not on file    Attends religious service: Not on file    Active member of club or organization: Not on file    Attends meetings of clubs or organizations: Not on file    Relationship status: Not on file  . Intimate partner violence:    Fear of current or ex partner: Not on file    Emotionally abused: Not on file    Physically abused: Not on file    Forced sexual activity: Not on file  Other Topics Concern  . Not on file  Social History Narrative  . Not on file    Family History  Problem Relation Age of Onset  . Hypertension Mother   . Alzheimer's disease Mother      Review of Systems  Constitutional: Negative.  Negative for chills, fever and weight loss.  HENT: Negative for congestion and sore throat.   Eyes: Negative for blurred vision and double vision.  Respiratory: Negative.  Negative for cough and shortness of breath.   Cardiovascular: Negative.  Negative for chest pain and palpitations.  Gastrointestinal: Negative.  Negative for abdominal pain, blood in stool, diarrhea, melena, nausea and vomiting.  Genitourinary: Negative.  Negative for dysuria and hematuria.  Musculoskeletal: Negative.  Negative for back pain, joint pain, myalgias and neck pain.  Skin: Negative.  Negative for rash.  Neurological: Negative for dizziness and headaches.  Endo/Heme/Allergies: Negative.   All other systems reviewed and are negative.  Vitals:   11/04/18 1352  BP: 130/87  Pulse: 79  Resp: 14  Temp: 97.7 F (36.5 C)  SpO2: 98%     Physical Exam Vitals signs reviewed.  Constitutional:      Appearance: Normal  appearance.  HENT:     Head: Normocephalic and atraumatic.     Nose: Nose normal.     Mouth/Throat:     Mouth: Mucous membranes are moist.     Pharynx: Oropharynx is clear.  Eyes:     Extraocular Movements: Extraocular movements intact.     Conjunctiva/sclera: Conjunctivae normal.     Pupils: Pupils are equal, round, and reactive to light.  Neck:  Musculoskeletal: Normal range of motion and neck supple.     Comments: Thyroidectomy scar Cardiovascular:     Rate and Rhythm: Normal rate and regular rhythm.     Pulses: Normal pulses.     Heart sounds: Normal heart sounds.  Pulmonary:     Effort: Pulmonary effort is normal.     Breath sounds: Normal breath sounds.  Abdominal:     Palpations: Abdomen is soft.     Tenderness: There is no abdominal tenderness.  Musculoskeletal: Normal range of motion.     Right lower leg: No edema.     Left lower leg: No edema.  Lymphadenopathy:     Cervical: No cervical adenopathy.  Skin:    General: Skin is warm and dry.     Capillary Refill: Capillary refill takes less than 2 seconds.  Neurological:     General: No focal deficit present.     Mental Status: She is alert and oriented to person, place, and time.  Psychiatric:        Mood and Affect: Mood normal.        Behavior: Behavior normal.      ASSESSMENT & PLAN: Derita was seen today for annual exam.  Diagnoses and all orders for this visit:  Routine general medical examination at a health care facility  Essential hypertension -     Thyroid Panel With TSH -     Lipid panel -     CBC with Differential/Platelet  Thyroid disease -     Thyroid Panel With TSH  Screening for hyperlipidemia -     Lipid panel -     Comprehensive metabolic panel  Screening for diabetes mellitus -     Hemoglobin A1c -     Comprehensive metabolic panel  Colon cancer screening -     Ambulatory referral to Gastroenterology   Patient Instructions       If you have lab work done today you  will be contacted with your lab results within the next 2 weeks.  If you have not heard from [place] then please contact us. The fastest way to get your results is to register for My Chart.   IF you received an x-ray today, you will receive an invoice from [facility]. Please contact [facility] at [phone] with questions or concerns regarding your invoice.   IF you received labwork today, you will receive an invoice from [facility]. Please contact [facility] at [phone] with questions or concerns regarding your invoice.   Our billing staff will not be able to assist you with questions regarding bills from these companies.  You will be contacted with the lab results as soon as they are available. The fastest way to get your results is to activate your My Chart account. Instructions are located on the last page of this paperwork. If you have not heard from [place] regarding the results in 2 weeks, please contact this office.      Health Maintenance, Female Adopting a healthy lifestyle and getting preventive care can go a long way to promote health and wellness. Talk with your health care provider about what schedule of regular examinations is right for you. This is a good chance for you to check in with your provider about disease prevention and staying healthy. In between checkups, there are plenty of things you can do on your own. Experts have done a lot of research about which lifestyle changes and preventive measures are most likely to keep you healthy. Ask  your health care provider for more information. Weight and diet Eat a healthy diet  Be sure to include plenty of vegetables, fruits, low-fat dairy products, and lean protein.  Do not eat a lot of foods high in solid fats, added sugars, or salt.  Get regular exercise. This is one of the most important things you can do for your health. ? Most adults should exercise for at least 150 minutes each week. The exercise should  increase your heart rate and make you sweat (moderate-intensity exercise). ? Most adults should also do strengthening exercises at least twice a week. This is in addition to the moderate-intensity exercise. Maintain a healthy weight  Body mass index (BMI) is a measurement that can be used to identify possible weight problems. It estimates body fat based on height and weight. Your health care provider can help determine your BMI and help you achieve or maintain a healthy weight.  For females 33 years of age and older: ? A BMI below 18.5 is considered underweight. ? A BMI of 18.5 to 24.9 is normal. ? A BMI of 25 to 29.9 is considered overweight. ? A BMI of 30 and above is considered obese. Watch levels of cholesterol and blood lipids  You should start having your blood tested for lipids and cholesterol at 59 years of age, then have this test every 5 years.  You may need to have your cholesterol levels checked more often if: ? Your lipid or cholesterol levels are high. ? You are older than 59 years of age. ? You are at high risk for heart disease. Cancer screening Lung Cancer  Lung cancer screening is recommended for adults 39-68 years old who are at high risk for lung cancer because of a history of smoking.  A yearly low-dose CT scan of the lungs is recommended for people who: ? Currently smoke. ? Have quit within the past 15 years. ? Have at least a 30-pack-year history of smoking. A pack year is smoking an average of one pack of cigarettes a day for 1 year.  Yearly screening should continue until it has been 15 years since you quit.  Yearly screening should stop if you develop a health problem that would prevent you from having lung cancer treatment. Breast Cancer  Practice breast self-awareness. This means understanding how your breasts normally appear and feel.  It also means doing regular breast self-exams. Let your health care provider know about any changes, no matter how  small.  If you are in your 20s or 30s, you should have a clinical breast exam (CBE) by a health care provider every 1-3 years as part of a regular health exam.  If you are 64 or older, have a CBE every year. Also consider having a breast X-ray (mammogram) every year.  If you have a family history of breast cancer, talk to your health care provider about genetic screening.  If you are at high risk for breast cancer, talk to your health care provider about having an MRI and a mammogram every year.  Breast cancer gene (BRCA) assessment is recommended for women who have family members with BRCA-related cancers. BRCA-related cancers include: ? Breast. ? Ovarian. ? Tubal. ? Peritoneal cancers.  Results of the assessment will determine the need for genetic counseling and BRCA1 and BRCA2 testing. Cervical Cancer Your health care provider may recommend that you be screened regularly for cancer of the pelvic organs (ovaries, uterus, and vagina). This screening involves a pelvic examination, including checking  for microscopic changes to the surface of your cervix (Pap test). You may be encouraged to have this screening done every 3 years, beginning at age 51.  For women ages 63-65, health care providers may recommend pelvic exams and Pap testing every 3 years, or they may recommend the Pap and pelvic exam, combined with testing for human papilloma virus (HPV), every 5 years. Some types of HPV increase your risk of cervical cancer. Testing for HPV may also be done on women of any age with unclear Pap test results.  Other health care providers may not recommend any screening for nonpregnant women who are considered low risk for pelvic cancer and who do not have symptoms. Ask your health care provider if a screening pelvic exam is right for you.  If you have had past treatment for cervical cancer or a condition that could lead to cancer, you need Pap tests and screening for cancer for at least 20 years  after your treatment. If Pap tests have been discontinued, your risk factors (such as having a new sexual partner) need to be reassessed to determine if screening should resume. Some women have medical problems that increase the chance of getting cervical cancer. In these cases, your health care provider may recommend more frequent screening and Pap tests. Colorectal Cancer  This type of cancer can be detected and often prevented.  Routine colorectal cancer screening usually begins at 59 years of age and continues through 59 years of age.  Your health care provider may recommend screening at an earlier age if you have risk factors for colon cancer.  Your health care provider may also recommend using home test kits to check for hidden blood in the stool.  A small camera at the end of a tube can be used to examine your colon directly (sigmoidoscopy or colonoscopy). This is done to check for the earliest forms of colorectal cancer.  Routine screening usually begins at age 66.  Direct examination of the colon should be repeated every 5-10 years through 59 years of age. However, you may need to be screened more often if early forms of precancerous polyps or small growths are found. Skin Cancer  Check your skin from head to toe regularly.  Tell your health care provider about any new moles or changes in moles, especially if there is a change in a mole's shape or color.  Also tell your health care provider if you have a mole that is larger than the size of a pencil eraser.  Always use sunscreen. Apply sunscreen liberally and repeatedly throughout the day.  Protect yourself by wearing long sleeves, pants, a wide-brimmed hat, and sunglasses whenever you are outside. Heart disease, diabetes, and high blood pressure  High blood pressure causes heart disease and increases the risk of stroke. High blood pressure is more likely to develop in: ? People who have blood pressure in the high end of the  normal range (130-139/85-89 mm Hg). ? People who are overweight or obese. ? People who are African American.  If you are 26-42 years of age, have your blood pressure checked every 3-5 years. If you are 27 years of age or older, have your blood pressure checked every year. You should have your blood pressure measured twice-once when you are at a hospital or clinic, and once when you are not at a hospital or clinic. Record the average of the two measurements. To check your blood pressure when you are not at a hospital or clinic,  you can use: ? An automated blood pressure machine at a pharmacy. ? A home blood pressure monitor.  If you are between 52 years and 33 years old, ask your health care provider if you should take aspirin to prevent strokes.  Have regular diabetes screenings. This involves taking a blood sample to check your fasting blood sugar level. ? If you are at a normal weight and have a low risk for diabetes, have this test once every three years after 59 years of age. ? If you are overweight and have a high risk for diabetes, consider being tested at a younger age or more often. Preventing infection Hepatitis B  If you have a higher risk for hepatitis B, you should be screened for this virus. You are considered at high risk for hepatitis B if: ? You were born in a country where hepatitis B is common. Ask your health care provider which countries are considered high risk. ? Your parents were born in a high-risk country, and you have not been immunized against hepatitis B (hepatitis B vaccine). ? You have HIV or AIDS. ? You use needles to inject street drugs. ? You live with someone who has hepatitis B. ? You have had sex with someone who has hepatitis B. ? You get hemodialysis treatment. ? You take certain medicines for conditions, including cancer, organ transplantation, and autoimmune conditions. Hepatitis C  Blood testing is recommended for: ? Everyone born from [birth year] through  1966. ? Anyone with known risk factors for hepatitis C. Sexually transmitted infections (STIs)  You should be screened for sexually transmitted infections (STIs) including gonorrhea and chlamydia if: ? You are sexually active and are younger than 59 years of age. ? You are older than 59 years of age and your health care provider tells you that you are at risk for this type of infection. ? Your sexual activity has changed since you were last screened and you are at an increased risk for chlamydia or gonorrhea. Ask your health care provider if you are at risk.  If you do not have HIV, but are at risk, it may be recommended that you take a prescription medicine daily to prevent HIV infection. This is called pre-exposure prophylaxis (PrEP). You are considered at risk if: ? You are sexually active and do not regularly use condoms or know the HIV status of your partner(s). ? You take drugs by injection. ? You are sexually active with a partner who has HIV. Talk with your health care provider about whether you are at high risk of being infected with HIV. If you choose to begin PrEP, you should first be tested for HIV. You should then be tested every 3 months for as long as you are taking PrEP. Pregnancy  If you are premenopausal and you may become pregnant, ask your health care provider about preconception counseling.  If you may become pregnant, take 400 to 800 micrograms (mcg) of folic acid every day.  If you want to prevent pregnancy, talk to your health care provider about birth control (contraception). Osteoporosis and menopause  Osteoporosis is a disease in which the bones lose minerals and strength with aging. This can result in serious bone fractures. Your risk for osteoporosis can be identified using a bone density scan.  If you are 19 years of age or older, or if you are at risk for osteoporosis and fractures, ask your health care provider if you should be screened.  Ask your health  care provider  whether you should take a calcium or vitamin D supplement to lower your risk for osteoporosis.  Menopause may have certain physical symptoms and risks.  Hormone replacement therapy may reduce some of these symptoms and risks. Talk to your health care provider about whether hormone replacement therapy is right for you. Follow these instructions at home:  Schedule regular health, dental, and eye exams.  Stay current with your immunizations.  Do not use any tobacco products including cigarettes, chewing tobacco, or electronic cigarettes.  If you are pregnant, do not drink alcohol.  If you are breastfeeding, limit how much and how often you drink alcohol.  Limit alcohol intake to no more than 1 drink per day for nonpregnant women. One drink equals 12 ounces of beer, 5 ounces of wine, or 1 ounces of hard liquor.  Do not use street drugs.  Do not share needles.  Ask your health care provider for help if you need support or information about quitting drugs.  Tell your health care provider if you often feel depressed.  Tell your health care provider if you have ever been abused or do not feel safe at home. This information is not intended to replace advice given to you by your health care provider. Make sure you discuss any questions you have with your health care provider. Document Released: 12/09/2010 Document Revised: 11/01/2015 Document Reviewed: 02/27/2015 Elsevier Interactive Patient Education  2019 Elsevier Inc.      Agustina Caroli, MD [facility]

## 2018-11-05 LAB — COMPREHENSIVE METABOLIC PANEL
ALT: 26 IU/L (ref 0–32)
AST: 20 IU/L (ref 0–40)
Albumin/Globulin Ratio: 1.5 (ref 1.2–2.2)
Albumin: 4.4 g/dL (ref 3.8–4.9)
Alkaline Phosphatase: 53 IU/L (ref 39–117)
BUN/Creatinine Ratio: 13 (ref 9–23)
BUN: 10 mg/dL (ref 6–24)
Bilirubin Total: 0.4 mg/dL (ref 0.0–1.2)
CO2: 26 mmol/L (ref 20–29)
Calcium: 10 mg/dL (ref 8.7–10.2)
Chloride: 100 mmol/L (ref 96–106)
Creatinine, Ser: 0.76 mg/dL (ref 0.57–1.00)
GFR calc Af Amer: 100 mL/min/{1.73_m2} (ref >59)
GFR calc non Af Amer: 87 mL/min/{1.73_m2} (ref >59)
Globulin, Total: 3 g/dL (ref 1.5–4.5)
Glucose: 85 mg/dL (ref 65–99)
Potassium: 3.9 mmol/L (ref 3.5–5.2)
Sodium: 139 mmol/L (ref 134–144)
Total Protein: 7.4 g/dL (ref 6.0–8.5)

## 2018-11-05 LAB — CBC WITH DIFFERENTIAL/PLATELET
Basophils Absolute: 0 10*3/uL (ref 0.0–0.2)
Basos: 1 %
EOS (ABSOLUTE): 0.1 10*3/uL (ref 0.0–0.4)
Eos: 1 %
Hematocrit: 41.6 % (ref 34.0–46.6)
Hemoglobin: 14.2 g/dL (ref 11.1–15.9)
Immature Grans (Abs): 0 10*3/uL (ref 0.0–0.1)
Immature Granulocytes: 0 %
Lymphocytes Absolute: 2.4 10*3/uL (ref 0.7–3.1)
Lymphs: 49 %
MCH: 29.2 pg (ref 26.6–33.0)
MCHC: 34.1 g/dL (ref 31.5–35.7)
MCV: 86 fL (ref 79–97)
Monocytes Absolute: 0.4 10*3/uL (ref 0.1–0.9)
Monocytes: 9 %
Neutrophils Absolute: 1.9 10*3/uL (ref 1.4–7.0)
Neutrophils: 40 %
Platelets: 373 10*3/uL (ref 150–450)
RBC: 4.86 x10E6/uL (ref 3.77–5.28)
RDW: 12.5 % (ref 11.7–15.4)
WBC: 4.8 10*3/uL (ref 3.4–10.8)

## 2018-11-05 LAB — THYROID PANEL WITH TSH
Free Thyroxine Index: 5.9 — ABNORMAL HIGH (ref 1.2–4.9)
T3 Uptake Ratio: 39 % (ref 24–39)
T4, Total: 15 ug/dL — ABNORMAL HIGH (ref 4.5–12.0)
TSH: 0.012 u[IU]/mL — ABNORMAL LOW (ref 0.450–4.500)

## 2018-11-05 LAB — LIPID PANEL
Chol/HDL Ratio: 3.8 ratio (ref 0.0–4.4)
Cholesterol, Total: 207 mg/dL — ABNORMAL HIGH (ref 100–199)
HDL: 55 mg/dL (ref >39)
LDL Calculated: 141 mg/dL — ABNORMAL HIGH (ref 0–99)
Triglycerides: 57 mg/dL (ref 0–149)
VLDL Cholesterol Cal: 11 mg/dL (ref 5–40)

## 2018-11-05 LAB — HEMOGLOBIN A1C
Est. average glucose Bld gHb Est-mCnc: 114 mg/dL
Hgb A1c MFr Bld: 5.6 % (ref 4.8–5.6)

## 2018-11-08 NOTE — Progress Notes (Signed)
lm

## 2018-12-02 ENCOUNTER — Telehealth: Payer: Self-pay | Admitting: *Deleted

## 2018-12-02 NOTE — Telephone Encounter (Signed)
Called patient mobile number left message in voice mail to pick up physical form at front check in.

## 2018-12-09 LAB — HM MAMMOGRAPHY

## 2018-12-21 ENCOUNTER — Encounter: Payer: Self-pay | Admitting: *Deleted

## 2019-01-10 ENCOUNTER — Other Ambulatory Visit: Payer: Self-pay | Admitting: Emergency Medicine

## 2019-01-10 DIAGNOSIS — E079 Disorder of thyroid, unspecified: Secondary | ICD-10-CM

## 2019-01-10 DIAGNOSIS — I10 Essential (primary) hypertension: Secondary | ICD-10-CM

## 2019-01-10 NOTE — Telephone Encounter (Signed)
Requested Prescriptions  Pending Prescriptions Disp Refills  . levothyroxine (SYNTHROID) 150 MCG tablet [Pharmacy Med Name: LEVOTHYROXINE 150 MCG TABLET] 90 tablet 3    Sig: TAKE 1 TABLET BY MOUTH EVERY DAY     Endocrinology:  Hypothyroid Agents Failed - 01/10/2019  1:41 AM      Failed - TSH needs to be rechecked within 3 months after an abnormal result. Refill until TSH is due.      Failed - TSH in normal range and within 360 days    TSH  Date Value Ref Range Status  11/04/2018 0.012 (L) 0.450 - 4.500 uIU/mL Final         Passed - Valid encounter within last 12 months    Recent Outpatient Visits          2 months ago Routine general medical examination at a health care facility   [facility], Ines Bloomer, MD   11 months ago Essential hypertension   Primary Care at [place], Ines Bloomer, MD   2 years ago Encounter for general adult medical examination with abnormal findings   Primary Care at [facility], Ines Bloomer, MD   3 years ago Essential hypertension   [facility] at [facility], [facility]   3 years ago Annual physical exam   [facility] at [facility], [facility]             . metoprolol succinate (TOPROL-XL) 25 MG 24 hr tablet [Pharmacy Med Name: METOPROLOL SUCC ER 25 MG TAB] 90 tablet 3    Sig: TAKE 1 TABLET BY MOUTH EVERY DAY     Cardiovascular:  Beta Blockers Passed - 01/10/2019  1:41 AM      Passed - Last BP in normal range    BP Readings from Last 1 Encounters:  11/04/18 130/87         Passed - Last Heart Rate in normal range    Pulse Readings from Last 1 Encounters:  11/04/18 79         Passed - Valid encounter within last 6 months    Recent Outpatient Visits          2 months ago Routine general medical examination at a health care facility   Primary Care at [place], Ines Bloomer, MD   11 months ago Essential hypertension   Primary Care at [place], Ines Bloomer, MD   2 years ago Encounter for  general adult medical examination with abnormal findings   Primary Care at [facility], Ines Bloomer, MD   3 years ago Essential hypertension   [facility] at [facility], [facility]   3 years ago Annual physical exam   [facility] at [facility], Loura Back, MD

## 2019-01-10 NOTE — Telephone Encounter (Signed)
Requested medications are due for refill today?  Yes  Requested medications are on the active medication list?  Yes  Last refill 01/22/2018  Future visit scheduled? No  Notes to clinic:  After patient had labs drawn at last visit and TSH was low, she was advised to take 1/2 of her regular dose levothyroxine.  Will forward to provider to address possibly sending prescription with new dosage.    Requested Prescriptions  Pending Prescriptions Disp Refills   levothyroxine (SYNTHROID) 150 MCG tablet [Pharmacy Med Name: LEVOTHYROXINE 150 MCG TABLET] 90 tablet 3    Sig: TAKE 1 TABLET BY MOUTH EVERY DAY     Endocrinology:  Hypothyroid Agents Failed - 01/10/2019  1:41 AM      Failed - TSH needs to be rechecked within 3 months after an abnormal result. Refill until TSH is due.      Failed - TSH in normal range and within 360 days    TSH  Date Value Ref Range Status  11/04/2018 0.012 (L) 0.450 - 4.500 uIU/mL Final         Passed - Valid encounter within last 12 months    Recent Outpatient Visits          2 months ago Routine general medical examination at a health care facility   Primary Care at [facility], Ines Bloomer, MD   11 months ago Essential hypertension   Primary Care at [place], Ines Bloomer, MD   2 years ago Encounter for general adult medical examination with abnormal findings   Primary Care at [facility], Ines Bloomer, MD   3 years ago Essential hypertension   [facility] at [facility], [facility]   3 years ago Annual physical exam   [facility] at [facility], [facility]             Signed Prescriptions Disp Refills   metoprolol succinate (TOPROL-XL) 25 MG 24 hr tablet 90 tablet 2    Sig: TAKE 1 [place]     Cardiovascular:  Beta Blockers Passed - 01/10/2019  1:41 AM      Passed - Last BP in normal range    BP Readings from Last 1 Encounters:  11/04/18 130/87         Passed - Last Heart Rate in normal range    Pulse Readings  from Last 1 Encounters:  11/04/18 79         Passed - Valid encounter within last 6 months    Recent Outpatient Visits          2 months ago Routine general medical examination at a health care facility   Primary Care at [place], Ines Bloomer, MD   11 months ago Essential hypertension   Primary Care at [place], Ines Bloomer, MD   2 years ago Encounter for general adult medical examination with abnormal findings   Primary Care at [facility], Ines Bloomer, MD   3 years ago Essential hypertension   [facility] at [facility], [facility]   3 years ago Annual physical exam   [facility] at [facility], Loura Back, MD

## 2019-01-22 ENCOUNTER — Other Ambulatory Visit: Payer: Self-pay | Admitting: Emergency Medicine

## 2019-01-22 DIAGNOSIS — E079 Disorder of thyroid, unspecified: Secondary | ICD-10-CM

## 2019-01-23 NOTE — Telephone Encounter (Signed)
Requested medication (s) are due for refill today:   Yes  Requested medication (s) are on the active medication list:   Yes  Future visit scheduled:   No   Instructed to take 1/2 pill and follow up with endocrinologist.   No appts noted.   Last ordered: 01/22/2018 - 01/22/2019  #90  3 refills.   See Dr. Barry Brunner note with latest lab results.   Requested Prescriptions  Pending Prescriptions Disp Refills   levothyroxine (SYNTHROID) 150 MCG tablet [Pharmacy Med Name: LEVOTHYROXINE 150 MCG TABLET] 90 tablet 3    Sig: TAKE 1 TABLET BY MOUTH EVERY DAY     Endocrinology:  Hypothyroid Agents Failed - 01/22/2019  4:22 PM      Failed - TSH needs to be rechecked within 3 months after an abnormal result. Refill until TSH is due.      Failed - TSH in normal range and within 360 days    TSH  Date Value Ref Range Status  11/04/2018 0.012 (L) 0.450 - 4.500 uIU/mL Final         Passed - Valid encounter within last 12 months    Recent Outpatient Visits          2 months ago Routine general medical examination at a health care facility   Primary Care at [place], Ines Bloomer, MD   1 year ago Essential hypertension   Primary Care at [place], Ines Bloomer, MD   2 years ago Encounter for general adult medical examination with abnormal findings   Primary Care at [facility], Ines Bloomer, MD   3 years ago Essential hypertension   [facility] at [facility], [facility]   3 years ago Annual physical exam   Primary [facility] at [facility], Loura Back, MD

## 2019-03-19 ENCOUNTER — Other Ambulatory Visit: Payer: Self-pay | Admitting: Emergency Medicine

## 2019-03-19 DIAGNOSIS — I10 Essential (primary) hypertension: Secondary | ICD-10-CM

## 2019-04-13 ENCOUNTER — Other Ambulatory Visit: Payer: Self-pay | Admitting: Emergency Medicine

## 2019-04-13 DIAGNOSIS — I10 Essential (primary) hypertension: Secondary | ICD-10-CM

## 2019-04-26 ENCOUNTER — Telehealth: Payer: Self-pay | Admitting: Emergency Medicine

## 2019-04-26 NOTE — Telephone Encounter (Signed)
Pt is needing a refill for her lisinopril-hydrochlorothiazide. Pt states that she wants the statement removed that she needs an OV for additional refills. Pt states that Dr. Mitchel Honour wants to see her after she see's a specialist in regards to her thyroid. After she see's the specialist, she will make an appt with [place] [facility]. Pt states she is on her last pill and needs a refill ASAP.

## 2019-04-27 ENCOUNTER — Other Ambulatory Visit: Payer: Self-pay | Admitting: *Deleted

## 2019-04-27 DIAGNOSIS — I10 Essential (primary) hypertension: Secondary | ICD-10-CM

## 2019-04-27 MED ORDER — LISINOPRIL-HYDROCHLOROTHIAZIDE 20-12.5 MG PO TABS: 1.0000 | ORAL_TABLET | Freq: Every day | ORAL | 0 refills | Status: DC

## 2019-04-27 NOTE — Telephone Encounter (Signed)
Prescription sent

## 2019-05-14 ENCOUNTER — Other Ambulatory Visit: Payer: Self-pay | Admitting: Emergency Medicine

## 2019-05-14 DIAGNOSIS — I10 Essential (primary) hypertension: Secondary | ICD-10-CM

## 2019-05-14 NOTE — Telephone Encounter (Signed)
Requested medication (s) are due for refill today: yes  Requested medication (s) are on the active medication list: yes  Last refill:  04/27/2019  Future visit scheduled: no  Notes to clinic: overdue for office visit  Review for refill   Requested Prescriptions  Pending Prescriptions Disp Refills   lisinopril-hydrochlorothiazide (ZESTORETIC) 20-12.5 MG tablet [Pharmacy Med Name: LISINOPRIL-HCTZ 20-12.5 MG TAB] 30 tablet 0    Sig: TAKE 1 TABLET BY MOUTH EVERY DAY     Cardiovascular:  ACEI + Diuretic Combos Failed - 05/14/2019 11:17 AM      Failed - Na in normal range and within 180 days    Sodium  Date Value Ref Range Status  11/04/2018 139 134 - 144 mmol/L Final         Failed - K in normal range and within 180 days    Potassium  Date Value Ref Range Status  11/04/2018 3.9 3.5 - 5.2 mmol/L Final         Failed - Cr in normal range and within 180 days    Creat  Date Value Ref Range Status  07/10/2015 0.90 0.50 - 1.05 mg/dL Final   Creatinine, Ser  Date Value Ref Range Status  11/04/2018 0.76 0.57 - 1.00 mg/dL Final         Failed - Ca in normal range and within 180 days    Calcium  Date Value Ref Range Status  11/04/2018 10.0 8.7 - 10.2 mg/dL Final         Failed - Valid encounter within last 6 months    Recent Outpatient Visits          6 months ago Routine general medical examination at a health care facility   Primary Care at [place], Ines Bloomer, MD   1 year ago Essential hypertension   Primary Care at [place], Ines Bloomer, MD   2 years ago Encounter for general adult medical examination with abnormal findings   Primary Care at [place], Ines Bloomer, MD   3 years ago Essential hypertension   [facility] at [facility], [facility]   3 years ago Annual physical exam   [facility] at [facility], [facility]             Passed - Patient is not pregnant      Passed - Last BP in normal range    BP Readings from Last 1  Encounters:  11/04/18 130/87

## 2019-07-07 IMAGING — CR DG CHEST 2V
2 series · 2 of 2 positions shown · non-contrast
Comparison: Chest radiograph 02/11/2008

CLINICAL DATA: Neck goiter.  Preop evaluation.

EXAM:
CHEST - 2 VIEW

[w chest pa]
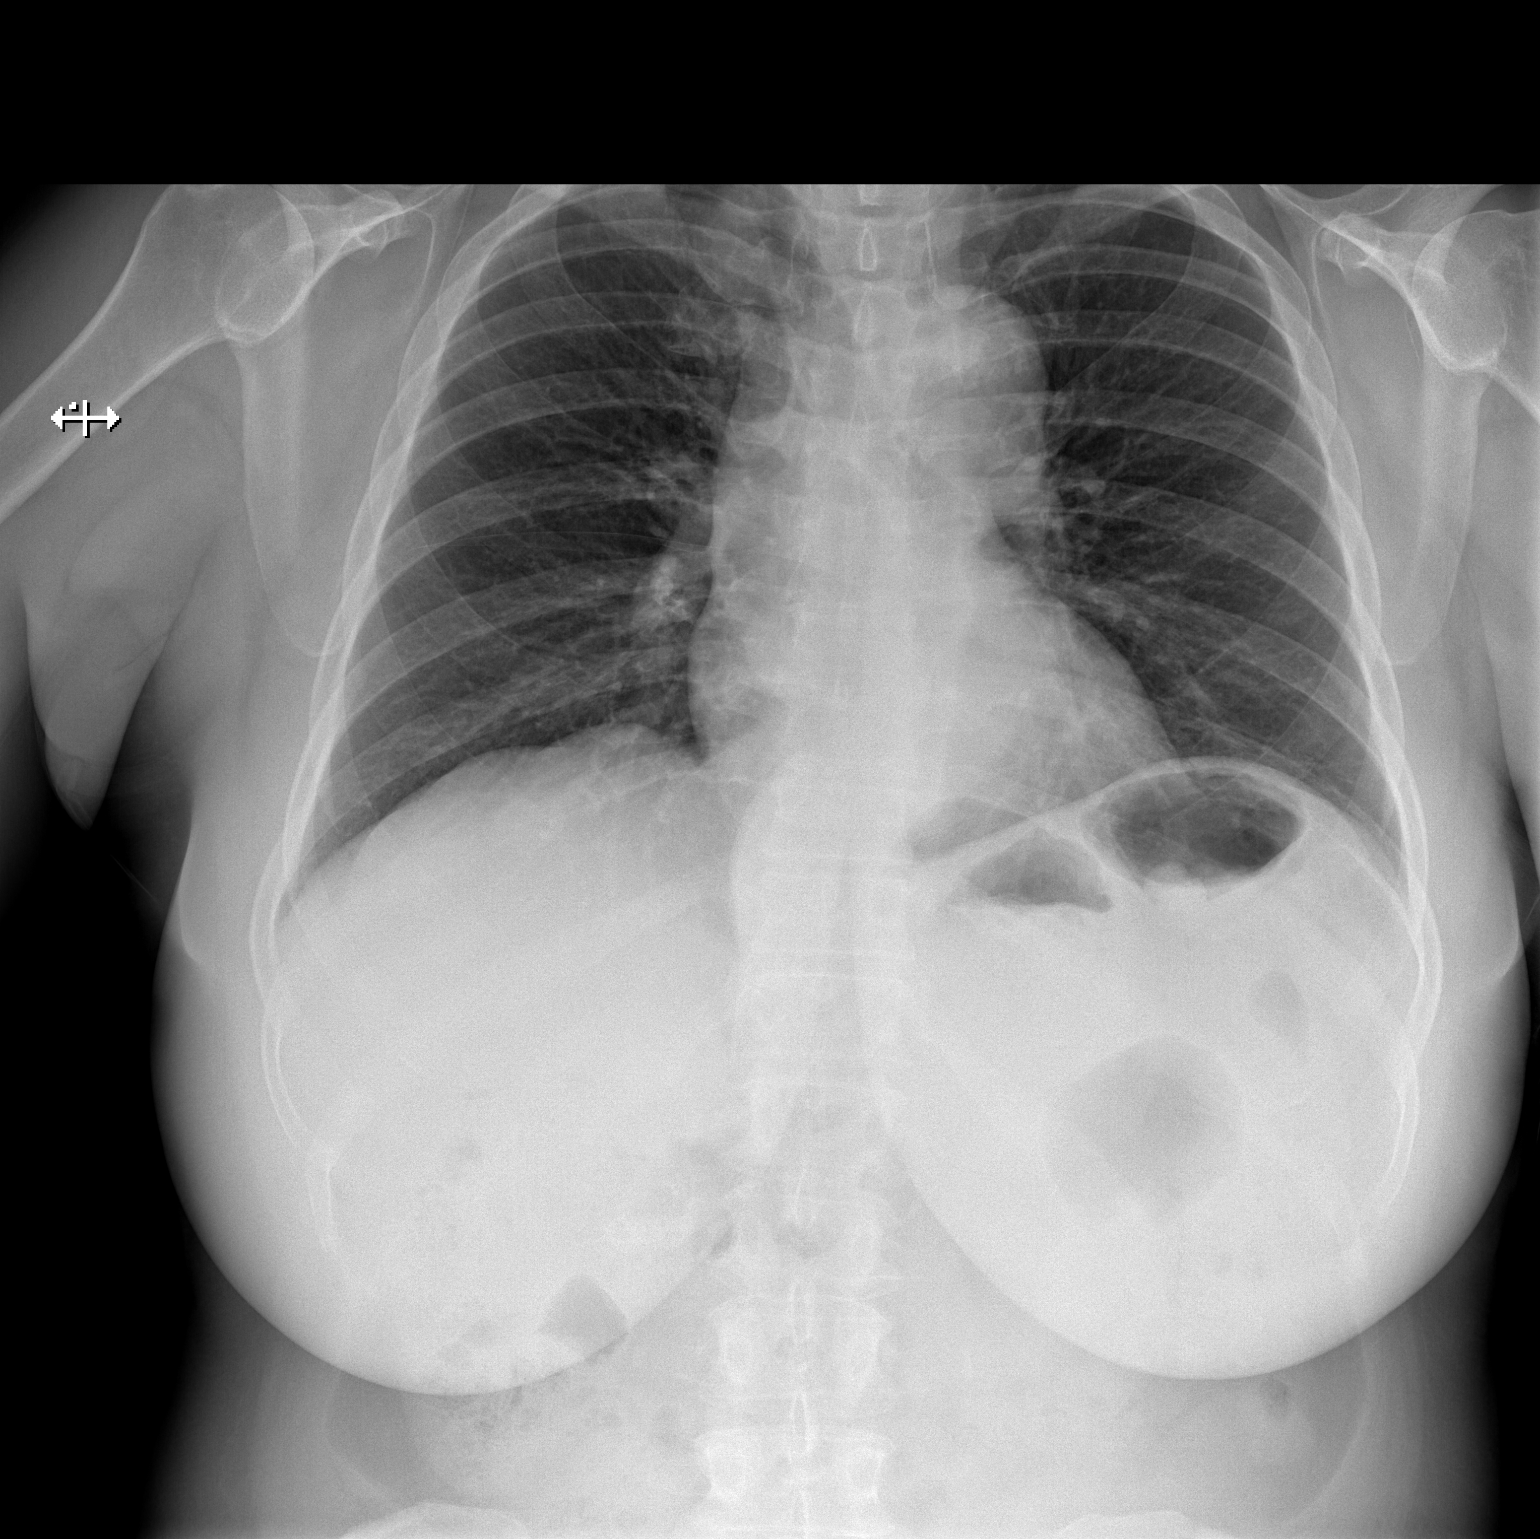

[w chest lat]
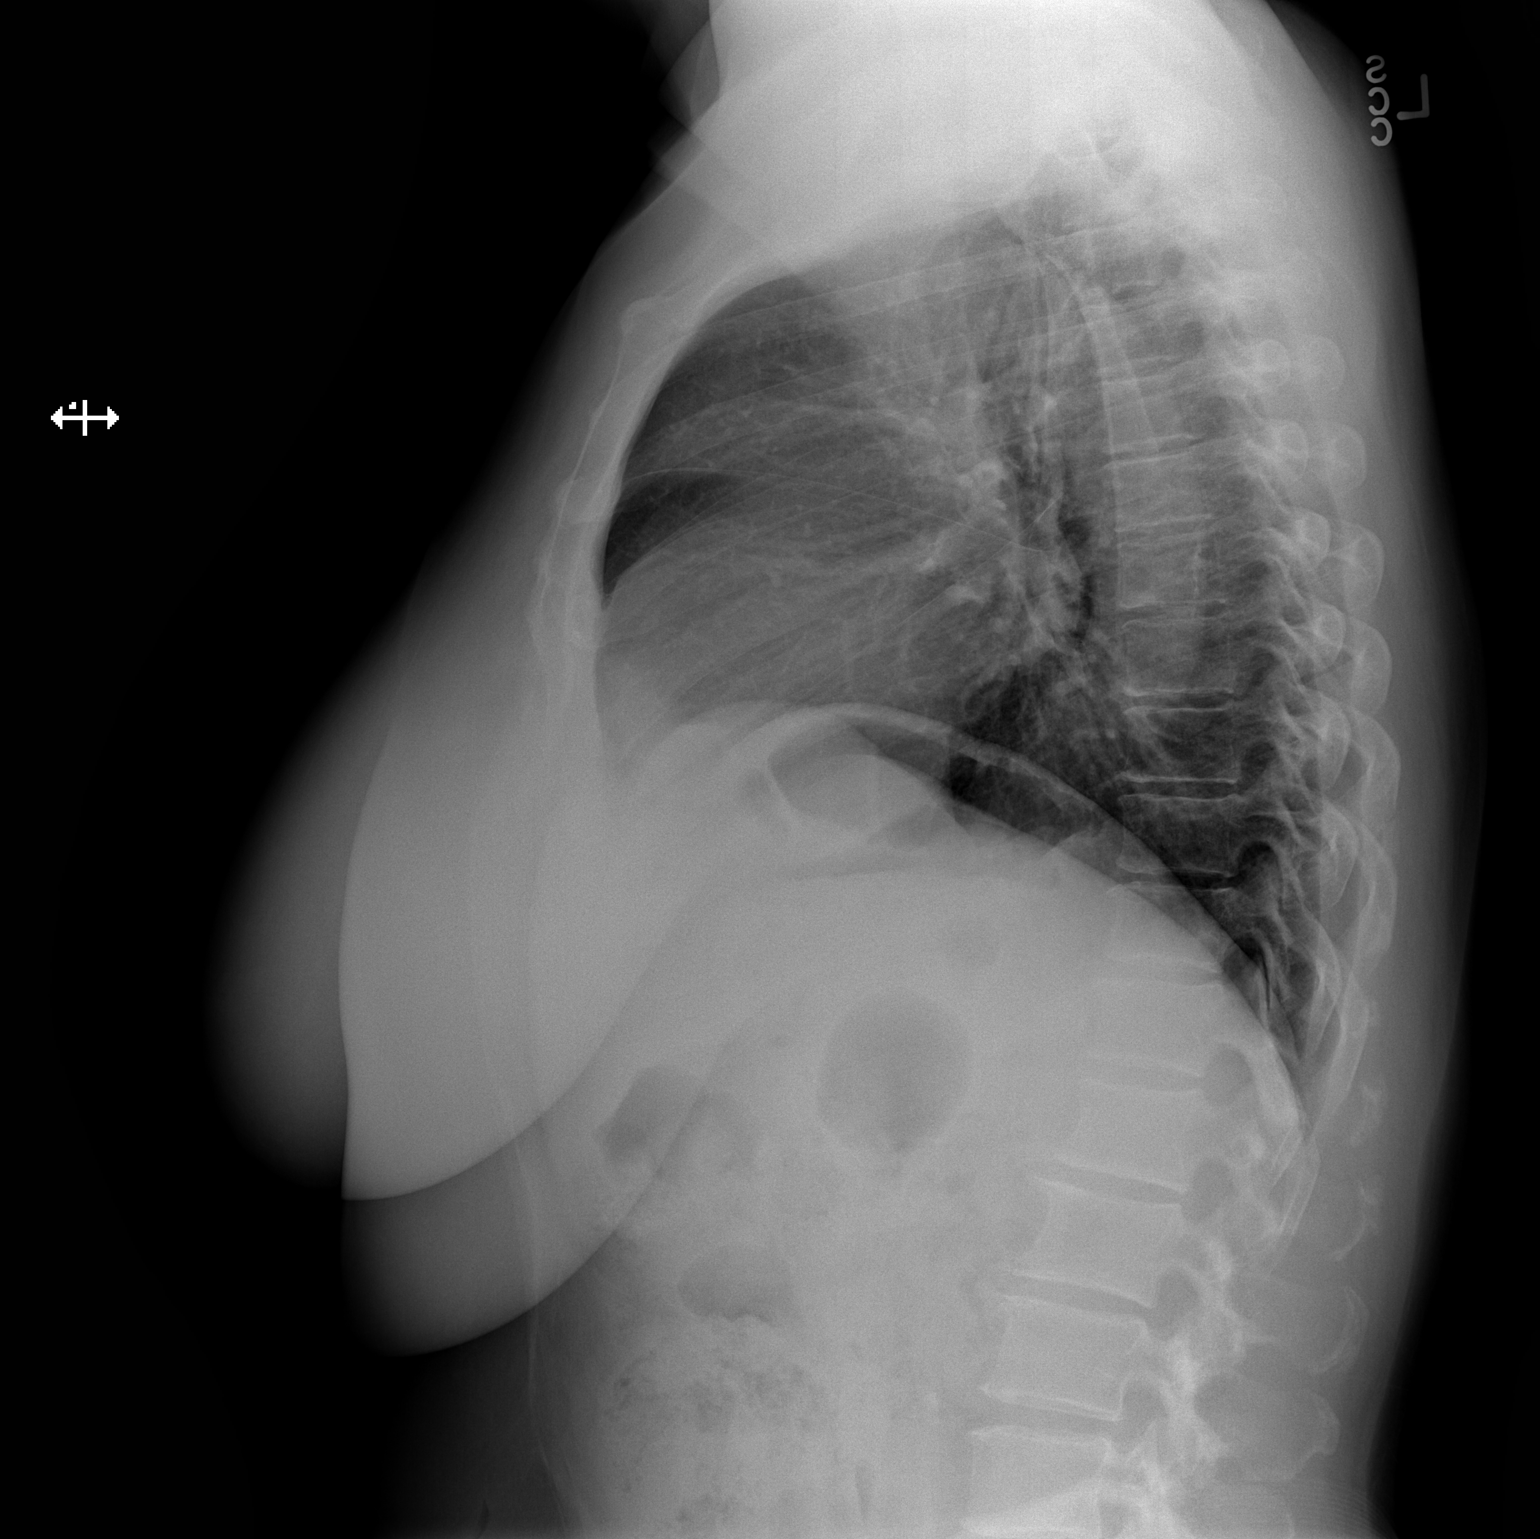

[2 of 2 positions shown; findings below may reference images not displayed]

FINDINGS: Stable cardiac and mediastinal contours. Low lung volumes. Bibasilar
atelectasis. No large area pulmonary consolidation. No pleural
effusion or pneumothorax. Regional skeleton is unremarkable.
IMPRESSION: No acute cardiopulmonary process.

## 2019-08-12 ENCOUNTER — Other Ambulatory Visit: Payer: Self-pay | Admitting: Family Medicine

## 2019-08-12 DIAGNOSIS — I10 Essential (primary) hypertension: Secondary | ICD-10-CM

## 2019-08-12 NOTE — Telephone Encounter (Signed)
Requested  medications are  due for refill today yes  Requested medications are on the active medication list yes  Last refill 12/11  Future visit scheduled no  Notes to clinic failed protocol due to no visit in 9 months

## 2019-08-16 NOTE — Telephone Encounter (Signed)
Lvmtcb and schedule appt for further refills

## 2019-09-06 ENCOUNTER — Other Ambulatory Visit: Payer: Self-pay | Admitting: Emergency Medicine

## 2019-09-06 DIAGNOSIS — I10 Essential (primary) hypertension: Secondary | ICD-10-CM

## 2019-09-06 NOTE — Telephone Encounter (Signed)
Past due for OV- pt has been notified and has already received courtesy refill.

## 2019-09-20 ENCOUNTER — Other Ambulatory Visit: Payer: Self-pay | Admitting: Emergency Medicine

## 2019-09-20 DIAGNOSIS — I10 Essential (primary) hypertension: Secondary | ICD-10-CM

## 2019-09-20 NOTE — Telephone Encounter (Signed)
Requested Prescriptions  Pending Prescriptions Disp Refills  . lisinopril-hydrochlorothiazide (ZESTORETIC) 20-12.5 MG tablet [Pharmacy Med Name: LISINOPRIL-HCTZ 20-12.5 MG TAB] 45 tablet 0    Sig: TAKE 1 TABLET BY MOUTH EVERY DAY     Cardiovascular:  ACEI + Diuretic Combos Failed - 09/20/2019 11:52 AM      Failed - Na in normal range and within 180 days    Sodium  Date Value Ref Range Status  11/04/2018 139 134 - 144 mmol/L Final         Failed - K in normal range and within 180 days    Potassium  Date Value Ref Range Status  11/04/2018 3.9 3.5 - 5.2 mmol/L Final         Failed - Cr in normal range and within 180 days    Creat  Date Value Ref Range Status  07/10/2015 0.90 0.50 - 1.05 mg/dL Final   Creatinine, Ser  Date Value Ref Range Status  11/04/2018 0.76 0.57 - 1.00 mg/dL Final         Failed - Ca in normal range and within 180 days    Calcium  Date Value Ref Range Status  11/04/2018 10.0 8.7 - 10.2 mg/dL Final         Failed - Valid encounter within last 6 months    Recent Outpatient Visits          10 months ago Routine general medical examination at a health care facility   Primary Care at [place], Ines Bloomer, MD   1 year ago Essential hypertension   Primary Care at [place], Ines Bloomer, MD   2 years ago Encounter for general adult medical examination with abnormal findings   Primary Care at [facility], Ines Bloomer, MD   3 years ago Essential hypertension   [facility] at [facility], [facility]   4 years ago Annual physical exam   [facility] at [facility], Loura Back, MD      Future Appointments            In 1 month [place], Ines Bloomer, MD Primary Care at [place], [place] - Patient is not pregnant      Passed - Last BP in normal range    BP Readings from Last 1 Encounters:  11/04/18 130/87         patient must keep appointment 11/08/19 for further refills

## 2019-10-17 ENCOUNTER — Other Ambulatory Visit: Payer: Self-pay | Admitting: Emergency Medicine

## 2019-10-17 DIAGNOSIS — I10 Essential (primary) hypertension: Secondary | ICD-10-CM

## 2019-10-23 ENCOUNTER — Other Ambulatory Visit: Payer: Self-pay | Admitting: Emergency Medicine

## 2019-10-23 DIAGNOSIS — I10 Essential (primary) hypertension: Secondary | ICD-10-CM

## 2019-10-23 NOTE — Telephone Encounter (Signed)
Requested medications are due for refill today?  Yes  Requested medications are on active medication list?  Yes  Last Refill:   01/10/2019  # 50 with 2 refills   Future visit scheduled?  No   Notes to Clinic:  Medication failed RX refill protocol due to no valid encounter in the last 6 months.  Patient's last office visit was 11 months ago.

## 2019-11-08 ENCOUNTER — Encounter: Payer: Self-pay | Admitting: Emergency Medicine

## 2019-11-08 ENCOUNTER — Ambulatory Visit (INDEPENDENT_AMBULATORY_CARE_PROVIDER_SITE_OTHER): Payer: Commercial Managed Care - PPO | Admitting: Emergency Medicine

## 2019-11-08 ENCOUNTER — Other Ambulatory Visit: Payer: Self-pay

## 2019-11-08 VITALS — BP 159/97 | HR 71 | Temp 97.6°F | Ht 65.5 in | Wt 205.0 lb

## 2019-11-08 DIAGNOSIS — Z13220 Encounter for screening for lipoid disorders: Secondary | ICD-10-CM | POA: Diagnosis not present

## 2019-11-08 DIAGNOSIS — Z1211 Encounter for screening for malignant neoplasm of colon: Secondary | ICD-10-CM

## 2019-11-08 DIAGNOSIS — I10 Essential (primary) hypertension: Secondary | ICD-10-CM

## 2019-11-08 DIAGNOSIS — Z0001 Encounter for general adult medical examination with abnormal findings: Secondary | ICD-10-CM | POA: Diagnosis not present

## 2019-11-08 DIAGNOSIS — E079 Disorder of thyroid, unspecified: Secondary | ICD-10-CM

## 2019-11-08 DIAGNOSIS — Z0000 Encounter for general adult medical examination without abnormal findings: Secondary | ICD-10-CM

## 2019-11-08 DIAGNOSIS — Z130 Encounter for screening for diseases of the blood and blood-forming organs and certain disorders involving the immune mechanism: Secondary | ICD-10-CM

## 2019-11-08 MED ORDER — LISINOPRIL-HYDROCHLOROTHIAZIDE 20-12.5 MG PO TABS: 1.0000 | ORAL_TABLET | Freq: Every day | ORAL | 1 refills | Status: DC

## 2019-11-08 MED ORDER — METOPROLOL SUCCINATE ER 25 MG PO TB24: 25.0000 mg | ORAL_TABLET | Freq: Every day | ORAL | 2 refills | Status: DC

## 2019-11-08 MED ORDER — LEVOTHYROXINE SODIUM 150 MCG PO TABS: 150.0000 ug | ORAL_TABLET | Freq: Every day | ORAL | 3 refills | Status: DC

## 2019-11-08 NOTE — Progress Notes (Addendum)
Grace [name] 59 y.o.   Chief Complaint  Patient presents with  . Annual Exam    HISTORY OF PRESENT ILLNESS: This is a 59 y.o. female here for her annual exam.  Has the following chronic medical problems: 1.  Hypertension: On Zestoretic 20-12.5 mg daily and Toprol XL 25 mg daily. 2.  History of thyroid disease: On Synthroid 150 mcg daily. Has no complaints or medical concerns today. Has GYN doctor. Health maintenance items discussed with patient.  HPI   Prior to Admission medications   Medication Sig Start Date End Date Taking? Authorizing Provider  levothyroxine (SYNTHROID) 150 MCG tablet Take 1 tablet (150 mcg total) by mouth daily. 11/08/19  Yes Monisha Siebel, Ines Bloomer, MD  lisinopril-hydrochlorothiazide (ZESTORETIC) 20-12.5 MG tablet Take 1 tablet by mouth daily. 11/08/19  Yes Sim Choquette, Ines Bloomer, MD  metoprolol succinate (TOPROL-XL) 25 MG 24 hr tablet Take 1 tablet (25 mg total) by mouth daily. 11/08/19  Yes Aaronmichael Brumbaugh, Ines Bloomer, MD  Multiple Vitamins-Minerals (MULTIVITAMIN PO) Take 1 tablet by mouth daily.   Yes [provider]    Allergies  Allergen Reactions  . Penicillins Other (See Comments)    Sisters are allergic Has patient had a PCN reaction causing immediate rash, facial/tongue/throat swelling, SOB or lightheadedness with hypotension: Unknown Has patient had a PCN reaction causing severe rash involving mucus membranes or skin necrosis: Unknown Has patient had a PCN reaction that required hospitalization: Unknown Has patient had a PCN reaction occurring within the last 10 years: Unknown If all of the above answers are "NO", then may proceed with Cephalosporin use.   . Amlodipine Swelling    Patient Active Problem List   Diagnosis Date Noted  . Multinodular goiter 12/25/2017  . Hyperlipidemia 07/10/2015  . Hearing loss 07/10/2015  . Hypertension 12/23/2011  . Goiter 12/23/2011  . Fibroids 12/23/2011    Past Medical History:  Diagnosis Date  .  Goiter 12/23/2011   multi nodule  . HOH (hard of hearing)   . Hypertension   . Wears partial dentures     Past Surgical History:  Procedure Laterality Date  . CESAREAN SECTION    . CYST EXCISION     thigh  . MULTIPLE TOOTH EXTRACTIONS    . THYROIDECTOMY Bilateral 12/25/2017   Procedure: TOTAL THYROIDECTOMY;  Surgeon: Melida Quitter, MD;  Location: [facility];  Service: ENT;  Laterality: Bilateral;  . TUBAL LIGATION      Social History   Socioeconomic History  . Marital status: Married    Spouse name: Not on file  . Number of children: Not on file  . Years of education: Not on file  . Highest education level: Not on file  Occupational History  . Not on file  Tobacco Use  . Smoking status: Never Smoker  . Smokeless tobacco: Never Used  Substance and Sexual Activity  . Alcohol use: No  . Drug use: No  . Sexual activity: Yes  Other Topics Concern  . Not on file  Social History Narrative  . Not on file   Social Determinants of Health   Financial Resource Strain:   . Difficulty of Paying Living Expenses:   Food Insecurity:   . Worried About Charity fundraiser in the Last Year:   . Arboriculturist in the Last Year:   Transportation Needs:   . Film/video editor (Medical):   Marland Kitchen Lack of Transportation (Non-Medical):   Physical Activity:   . Days of Exercise per Week:   .  Minutes of Exercise per Session:   Stress:   . Feeling of Stress :   Social Connections:   . Frequency of Communication with Friends and Family:   . Frequency of Social Gatherings with Friends and Family:   . Attends Religious Services:   . Active Member of Clubs or Organizations:   . Attends Archivist Meetings:   Marland Kitchen Marital Status:   Intimate Partner Violence:   . Fear of Current or Ex-Partner:   . Emotionally Abused:   Marland Kitchen Physically Abused:   . Sexually Abused:     Family History  Problem Relation Age of Onset  . Hypertension Mother   . Alzheimer's disease Mother   .  Hypertension Father      Review of Systems  Constitutional: Negative.  Negative for chills and fever.  HENT: Positive for hearing loss. Negative for congestion and sore throat.   Respiratory: Negative.  Negative for cough and shortness of breath.   Cardiovascular: Negative.  Negative for chest pain and palpitations.  Gastrointestinal: Negative.  Negative for abdominal pain, blood in stool, diarrhea, melena, nausea and vomiting.  Genitourinary: Negative.  Negative for dysuria and hematuria.  Musculoskeletal: Negative.  Negative for back pain, myalgias and neck pain.  Skin: Negative.  Negative for rash.  Neurological: Negative for dizziness and headaches.  All other systems reviewed and are negative.  Today's Vitals   11/08/19 1430 11/08/19 1456  BP: (!) 156/100 (!) 159/97  Pulse: 71   Temp: 97.6 F (36.4 C)   SpO2: 95%   Weight: 205 lb (93 kg)   Height: 5' 5.5" (1.664 m)    Body mass index is 33.59 kg/m.   Physical Exam Vitals reviewed.  Constitutional:      Appearance: Normal appearance.  HENT:     Head: Normocephalic.     Ears:     Comments: Hearing aid on right ear Eyes:     Extraocular Movements: Extraocular movements intact.     Conjunctiva/sclera: Conjunctivae normal.     Pupils: Pupils are equal, round, and reactive to light.  Neck:     Comments: Surgical scar from thyroid removal Cardiovascular:     Rate and Rhythm: Normal rate and regular rhythm.     Pulses: Normal pulses.     Heart sounds: Normal heart sounds.  Pulmonary:     Effort: Pulmonary effort is normal.     Breath sounds: Normal breath sounds.  Abdominal:     General: Bowel sounds are normal. There is no distension.     Palpations: Abdomen is soft. There is no mass.     Tenderness: There is no abdominal tenderness.  Musculoskeletal:        General: Normal range of motion.     Cervical back: Normal range of motion and neck supple. No tenderness.     Right lower leg: No edema.     Left lower  leg: No edema.  Lymphadenopathy:     Cervical: No cervical adenopathy.  Skin:    General: Skin is warm and dry.     Capillary Refill: Capillary refill takes less than 2 seconds.  Neurological:     General: No focal deficit present.     Mental Status: She is alert and oriented to person, place, and time.  Psychiatric:        Mood and Affect: Mood normal.        Behavior: Behavior normal.      ASSESSMENT & PLAN: Hypertension Elevated blood pressure.  Patient noncompliant with medications.  Advised to take medications on a daily basis.  Diet and nutrition discussed as well as importance of physical exercise.  Blood work done today. Follow-up in 6 months.   [place] was seen today for annual exam.  Diagnoses and all orders for this visit:  Routine general medical examination at a health care facility  Thyroid disease -     levothyroxine (SYNTHROID) 150 MCG tablet; Take 1 tablet (150 mcg total) by mouth daily. -     Thyroid Panel With TSH  Essential hypertension -     lisinopril-hydrochlorothiazide (ZESTORETIC) 20-12.5 MG tablet; Take 1 tablet by mouth daily. -     metoprolol succinate (TOPROL-XL) 25 MG 24 hr tablet; Take 1 tablet (25 mg total) by mouth daily. -     Lipid panel -     CMP14+EGFR  Screening for hyperlipidemia -     Lipid panel  Colon cancer screening -     Cologuard  Screening for deficiency anemia -     CBC    Patient Instructions       If you have lab work done today you will be contacted with your lab results within the next 2 weeks.  If you have not heard from [place] then please contact us. The fastest way to get your results is to register for My Chart.   IF you received an x-ray today, you will receive an invoice from [facility]. Please contact [facility] at [phone] with questions or concerns regarding your invoice.   IF you received labwork today, you will receive an invoice from [facility]. Please contact [facility] at  [phone] with questions or concerns regarding your invoice.   Our billing staff will not be able to assist you with questions regarding bills from these companies.  You will be contacted with the lab results as soon as they are available. The fastest way to get your results is to activate your My Chart account. Instructions are located on the last page of this paperwork. If you have not heard from [place] regarding the results in 2 weeks, please contact this office.       Health Maintenance, Female Adopting a healthy lifestyle and getting preventive care are important in promoting health and wellness. Ask your health care provider about:  The right schedule for you to have regular tests and exams.  Things you can do on your own to prevent diseases and keep yourself healthy. What should I know about diet, weight, and exercise? Eat a healthy diet   Eat a diet that includes plenty of vegetables, fruits, low-fat dairy products, and lean protein.  Do not eat a lot of foods that are high in solid fats, added sugars, or sodium. Maintain a healthy weight Body mass index (BMI) is used to identify weight problems. It estimates body fat based on height and weight. Your health care provider can help determine your BMI and help you achieve or maintain a healthy weight. Get regular exercise Get regular exercise. This is one of the most important things you can do for your health. Most adults should:  Exercise for at least 150 minutes each week. The exercise should increase your heart rate and make you sweat (moderate-intensity exercise).  Do strengthening exercises at least twice a week. This is in addition to the moderate-intensity exercise.  Spend less time sitting. Even light physical activity can be beneficial. Watch cholesterol and blood lipids Have your blood tested for lipids and cholesterol at 60  years of age, then have this test every 5 years. Have your cholesterol levels checked more  often if:  Your lipid or cholesterol levels are high.  You are older than 60 years of age.  You are at high risk for heart disease. What should I know about cancer screening? Depending on your health history and family history, you may need to have cancer screening at various ages. This may include screening for:  Breast cancer.  Cervical cancer.  Colorectal cancer.  Skin cancer.  Lung cancer. What should I know about heart disease, diabetes, and high blood pressure? Blood pressure and heart disease  High blood pressure causes heart disease and increases the risk of stroke. This is more likely to develop in people who have high blood pressure readings, are of African descent, or are overweight.  Have your blood pressure checked: ? Every 3-5 years if you are 28-22 years of age. ? Every year if you are 4 years old or older. Diabetes Have regular diabetes screenings. This checks your fasting blood sugar level. Have the screening done:  Once every three years after age 54 if you are at a normal weight and have a low risk for diabetes.  More often and at a younger age if you are overweight or have a high risk for diabetes. What should I know about preventing infection? Hepatitis B If you have a higher risk for hepatitis B, you should be screened for this virus. Talk with your health care provider to find out if you are at risk for hepatitis B infection. Hepatitis C Testing is recommended for:  Everyone born from [birth year] through 1966.  Anyone with known risk factors for hepatitis C. Sexually transmitted infections (STIs)  Get screened for STIs, including gonorrhea and chlamydia, if: ? You are sexually active and are younger than 60 years of age. ? You are older than 60 years of age and your health care provider tells you that you are at risk for this type of infection. ? Your sexual activity has changed since you were last screened, and you are at increased risk for chlamydia  or gonorrhea. Ask your health care provider if you are at risk.  Ask your health care provider about whether you are at high risk for HIV. Your health care provider may recommend a prescription medicine to help prevent HIV infection. If you choose to take medicine to prevent HIV, you should first get tested for HIV. You should then be tested every 3 months for as long as you are taking the medicine. Pregnancy  If you are about to stop having your period (premenopausal) and you may become pregnant, seek counseling before you get pregnant.  Take 400 to 800 micrograms (mcg) of folic acid every day if you become pregnant.  Ask for birth control (contraception) if you want to prevent pregnancy. Osteoporosis and menopause Osteoporosis is a disease in which the bones lose minerals and strength with aging. This can result in bone fractures. If you are 71 years old or older, or if you are at risk for osteoporosis and fractures, ask your health care provider if you should:  Be screened for bone loss.  Take a calcium or vitamin D supplement to lower your risk of fractures.  Be given hormone replacement therapy (HRT) to treat symptoms of menopause. Follow these instructions at home: Lifestyle  Do not use any products that contain nicotine or tobacco, such as cigarettes, e-cigarettes, and chewing tobacco. If you need help quitting, ask  your health care provider.  Do not use street drugs.  Do not share needles.  Ask your health care provider for help if you need support or information about quitting drugs. Alcohol use  Do not drink alcohol if: ? Your health care provider tells you not to drink. ? You are pregnant, may be pregnant, or are planning to become pregnant.  If you drink alcohol: ? Limit how much you use to 0-1 drink a day. ? Limit intake if you are breastfeeding.  Be aware of how much alcohol is in your drink. In the [place] one drink equals one 12 oz bottle of beer (355 mL), one 5 oz  glass of wine (148 mL), or one 1 oz glass of hard liquor (44 mL). General instructions  Schedule regular health, dental, and eye exams.  Stay current with your vaccines.  Tell your health care provider if: ? You often feel depressed. ? You have ever been abused or do not feel safe at home. Summary  Adopting a healthy lifestyle and getting preventive care are important in promoting health and wellness.  Follow your health care provider's instructions about healthy diet, exercising, and getting tested or screened for diseases.  Follow your health care provider's instructions on monitoring your cholesterol and blood pressure. This information is not intended to replace advice given to you by your health care provider. Make sure you discuss any questions you have with your health care provider. Document Revised: 05/19/2018 Document Reviewed: 05/19/2018 Elsevier Patient Education  [place].  Hypertension, Adult High blood pressure (hypertension) is when the force of blood pumping through the arteries is too strong. The arteries are the blood vessels that carry blood from the heart throughout the body. Hypertension forces the heart to work harder to pump blood and may cause arteries to become narrow or stiff. Untreated or uncontrolled hypertension can cause a heart attack, heart failure, a stroke, kidney disease, and other problems. A blood pressure reading consists of a higher number over a lower number. Ideally, your blood pressure should be below 120/80. The first ("top") number is called the systolic pressure. It is a measure of the pressure in your arteries as your heart beats. The second ("bottom") number is called the diastolic pressure. It is a measure of the pressure in your arteries as the heart relaxes. What are the causes? The exact cause of this condition is not known. There are some conditions that result in or are related to high blood pressure. What increases the  risk? Some risk factors for high blood pressure are under your control. The following factors may make you more likely to develop this condition:  Smoking.  Having type 2 diabetes mellitus, high cholesterol, or both.  Not getting enough exercise or physical activity.  Being overweight.  Having too much fat, sugar, calories, or salt (sodium) in your diet.  Drinking too much alcohol. Some risk factors for high blood pressure may be difficult or impossible to change. Some of these factors include:  Having chronic kidney disease.  Having a family history of high blood pressure.  Age. Risk increases with age.  Race. You may be at higher risk if you are African American.  Gender. Men are at higher risk than women before age 84. After age 78, women are at higher risk than men.  Having obstructive sleep apnea.  Stress. What are the signs or symptoms? High blood pressure may not cause symptoms. Very high blood pressure (hypertensive crisis) may cause:  Headache.  Anxiety.  Shortness of breath.  Nosebleed.  Nausea and vomiting.  Vision changes.  Severe chest pain.  Seizures. How is this diagnosed? This condition is diagnosed by measuring your blood pressure while you are seated, with your arm resting on a flat surface, your legs uncrossed, and your feet flat on the floor. The cuff of the blood pressure monitor will be placed directly against the skin of your upper arm at the level of your heart. It should be measured at least twice using the same arm. Certain conditions can cause a difference in blood pressure between your right and left arms. Certain factors can cause blood pressure readings to be lower or higher than normal for a short period of time:  When your blood pressure is higher when you are in a health care provider's office than when you are at home, this is called white coat hypertension. Most people with this condition do not need medicines.  When your blood  pressure is higher at home than when you are in a health care provider's office, this is called masked hypertension. Most people with this condition may need medicines to control blood pressure. If you have a high blood pressure reading during one visit or you have normal blood pressure with other risk factors, you may be asked to:  Return on a different day to have your blood pressure checked again.  Monitor your blood pressure at home for 1 week or longer. If you are diagnosed with hypertension, you may have other blood or imaging tests to help your health care provider understand your overall risk for other conditions. How is this treated? This condition is treated by making healthy lifestyle changes, such as eating healthy foods, exercising more, and reducing your alcohol intake. Your health care provider may prescribe medicine if lifestyle changes are not enough to get your blood pressure under control, and if:  Your systolic blood pressure is above 130.  Your diastolic blood pressure is above 80. Your personal target blood pressure may vary depending on your medical conditions, your age, and other factors. Follow these instructions at home: Eating and drinking   Eat a diet that is high in fiber and potassium, and low in sodium, added sugar, and fat. An example eating plan is called the DASH (Dietary Approaches to Stop Hypertension) diet. To eat this way: ? Eat plenty of fresh fruits and vegetables. Try to fill one half of your plate at each meal with fruits and vegetables. ? Eat whole grains, such as whole-wheat pasta, brown rice, or whole-grain bread. Fill about one fourth of your plate with whole grains. ? Eat or drink low-fat dairy products, such as skim milk or low-fat yogurt. ? Avoid fatty cuts of meat, processed or cured meats, and poultry with skin. Fill about one fourth of your plate with lean proteins, such as fish, chicken without skin, beans, eggs, or tofu. ? Avoid pre-made  and processed foods. These tend to be higher in sodium, added sugar, and fat.  Reduce your daily sodium intake. Most people with hypertension should eat less than 1,500 mg of sodium a day.  Do not drink alcohol if: ? Your health care provider tells you not to drink. ? You are pregnant, may be pregnant, or are planning to become pregnant.  If you drink alcohol: ? Limit how much you use to:  0-1 drink a day for women.  0-2 drinks a day for men. ? Be aware of how much alcohol is  in your drink. In the [place] one drink equals one 12 oz bottle of beer (355 mL), one 5 oz glass of wine (148 mL), or one 1 oz glass of hard liquor (44 mL). Lifestyle   Work with your health care provider to maintain a healthy body weight or to lose weight. Ask what an ideal weight is for you.  Get at least 30 minutes of exercise most days of the week. Activities may include walking, swimming, or biking.  Include exercise to strengthen your muscles (resistance exercise), such as Pilates or lifting weights, as part of your weekly exercise routine. Try to do these types of exercises for 30 minutes at least 3 days a week.  Do not use any products that contain nicotine or tobacco, such as cigarettes, e-cigarettes, and chewing tobacco. If you need help quitting, ask your health care provider.  Monitor your blood pressure at home as told by your health care provider.  Keep all follow-up visits as told by your health care provider. This is important. Medicines  Take over-the-counter and prescription medicines only as told by your health care provider. Follow directions carefully. Blood pressure medicines must be taken as prescribed.  Do not skip doses of blood pressure medicine. Doing this puts you at risk for problems and can make the medicine less effective.  Ask your health care provider about side effects or reactions to medicines that you should watch for. Contact a health care provider if you:  Think you are  having a reaction to a medicine you are taking.  Have headaches that keep coming back (recurring).  Feel dizzy.  Have swelling in your ankles.  Have trouble with your vision. Get help right away if you:  Develop a severe headache or confusion.  Have unusual weakness or numbness.  Feel faint.  Have severe pain in your chest or abdomen.  Vomit repeatedly.  Have trouble breathing. Summary  Hypertension is when the force of blood pumping through your arteries is too strong. If this condition is not controlled, it may put you at risk for serious complications.  Your personal target blood pressure may vary depending on your medical conditions, your age, and other factors. For most people, a normal blood pressure is less than 120/80.  Hypertension is treated with lifestyle changes, medicines, or a combination of both. Lifestyle changes include losing weight, eating a healthy, low-sodium diet, exercising more, and limiting alcohol. This information is not intended to replace advice given to you by your health care provider. Make sure you discuss any questions you have with your health care provider. Document Revised: 02/03/2018 Document Reviewed: 02/03/2018 Elsevier Patient Education  2020 [employer]       Agustina Caroli, MD [facility]

## 2019-11-08 NOTE — Patient Instructions (Addendum)
   If you have lab work done today you will be contacted with your lab results within the next 2 weeks.  If you have not heard from us then please contact us. The fastest way to get your results is to register for My Chart.   IF you received an x-ray today, you will receive an invoice from [facility]. Please contact [facility] at [phone] with questions or concerns regarding your invoice.   IF you received labwork today, you will receive an invoice from [facility]. Please contact [facility] at [phone] with questions or concerns regarding your invoice.   Our billing staff will not be able to assist you with questions regarding bills from these companies.  You will be contacted with the lab results as soon as they are available. The fastest way to get your results is to activate your My Chart account. Instructions are located on the last page of this paperwork. If you have not heard from us regarding the results in 2 weeks, please contact this office.       Health Maintenance, Female Adopting a healthy lifestyle and getting preventive care are important in promoting health and wellness. Ask your health care provider about:  The right schedule for you to have regular tests and exams.  Things you can do on your own to prevent diseases and keep yourself healthy. What should I know about diet, weight, and exercise? Eat a healthy diet   Eat a diet that includes plenty of vegetables, fruits, low-fat dairy products, and lean protein.  Do not eat a lot of foods that are high in solid fats, added sugars, or sodium. Maintain a healthy weight Body mass index (BMI) is used to identify weight problems. It estimates body fat based on height and weight. Your health care provider can help determine your BMI and help you achieve or maintain a healthy weight. Get regular exercise Get regular exercise. This is one of the most important things you can do for your health. Most  adults should:  Exercise for at least 150 minutes each week. The exercise should increase your heart rate and make you sweat (moderate-intensity exercise).  Do strengthening exercises at least twice a week. This is in addition to the moderate-intensity exercise.  Spend less time sitting. Even light physical activity can be beneficial. Watch cholesterol and blood lipids Have your blood tested for lipids and cholesterol at 60 years of age, then have this test every 5 years. Have your cholesterol levels checked more often if:  Your lipid or cholesterol levels are high.  You are older than 60 years of age.  You are at high risk for heart disease. What should I know about cancer screening? Depending on your health history and family history, you may need to have cancer screening at various ages. This may include screening for:  Breast cancer.  Cervical cancer.  Colorectal cancer.  Skin cancer.  Lung cancer. What should I know about heart disease, diabetes, and high blood pressure? Blood pressure and heart disease  High blood pressure causes heart disease and increases the risk of stroke. This is more likely to develop in people who have high blood pressure readings, are of African descent, or are overweight.  Have your blood pressure checked: ? Every 3-5 years if you are 18-39 years of age. ? Every year if you are 40 years old or older. Diabetes Have regular diabetes screenings. This checks your fasting blood sugar level. Have the screening done:  Once   every three years after age 40 if you are at a normal weight and have a low risk for diabetes.  More often and at a younger age if you are overweight or have a high risk for diabetes. What should I know about preventing infection? Hepatitis B If you have a higher risk for hepatitis B, you should be screened for this virus. Talk with your health care provider to find out if you are at risk for hepatitis B infection. Hepatitis  C Testing is recommended for:  Everyone born from 1945 through 1965.  Anyone with known risk factors for hepatitis C. Sexually transmitted infections (STIs)  Get screened for STIs, including gonorrhea and chlamydia, if: ? You are sexually active and are younger than 60 years of age. ? You are older than 60 years of age and your health care provider tells you that you are at risk for this type of infection. ? Your sexual activity has changed since you were last screened, and you are at increased risk for chlamydia or gonorrhea. Ask your health care provider if you are at risk.  Ask your health care provider about whether you are at high risk for HIV. Your health care provider may recommend a prescription medicine to help prevent HIV infection. If you choose to take medicine to prevent HIV, you should first get tested for HIV. You should then be tested every 3 months for as long as you are taking the medicine. Pregnancy  If you are about to stop having your period (premenopausal) and you may become pregnant, seek counseling before you get pregnant.  Take 400 to 800 micrograms (mcg) of folic acid every day if you become pregnant.  Ask for birth control (contraception) if you want to prevent pregnancy. Osteoporosis and menopause Osteoporosis is a disease in which the bones lose minerals and strength with aging. This can result in bone fractures. If you are 65 years old or older, or if you are at risk for osteoporosis and fractures, ask your health care provider if you should:  Be screened for bone loss.  Take a calcium or vitamin D supplement to lower your risk of fractures.  Be given hormone replacement therapy (HRT) to treat symptoms of menopause. Follow these instructions at home: Lifestyle  Do not use any products that contain nicotine or tobacco, such as cigarettes, e-cigarettes, and chewing tobacco. If you need help quitting, ask your health care provider.  Do not use street  drugs.  Do not share needles.  Ask your health care provider for help if you need support or information about quitting drugs. Alcohol use  Do not drink alcohol if: ? Your health care provider tells you not to drink. ? You are pregnant, may be pregnant, or are planning to become pregnant.  If you drink alcohol: ? Limit how much you use to 0-1 drink a day. ? Limit intake if you are breastfeeding.  Be aware of how much alcohol is in your drink. In the [place] one drink equals one 12 oz bottle of beer (355 mL), one 5 oz glass of wine (148 mL), or one 1 oz glass of hard liquor (44 mL). General instructions  Schedule regular health, dental, and eye exams.  Stay current with your vaccines.  Tell your health care provider if: ? You often feel depressed. ? You have ever been abused or do not feel safe at home. Summary  Adopting a healthy lifestyle and getting preventive care are important in promoting health and   Follow your health care provider's instructions about healthy diet, exercising, and getting tested or screened for diseases.  Follow your health care provider's instructions on monitoring your cholesterol and blood pressure. This information is not intended to replace advice given to you by your health care provider. Make sure you discuss any questions you have with your health care provider. Document Revised: 05/19/2018 Document Reviewed: 05/19/2018 Elsevier Patient Education  2020 Else[employer] Inc[employer]  Hypertension, Adult High blood pressure (hypertension) is when the force of blood pumping through the arteries is too strong. The arteries are the blood vessels that carry blood from the heart throughout the body. Hypertension forces the heart to work harder to pump blood and may cause arteries to become narrow or stiff. Untreated or uncontrolled hypertension can cause a heart attack, heart failure, a stroke, kidney disease, and other problems. A blood pressure reading  consists of a higher number over a lower number. Ideally, your blood pressure should be below 120/80. The first ("top") number is called the systolic pressure. It is a measure of the pressure in your arteries as your heart beats. The second ("bottom") number is called the diastolic pressure. It is a measure of the pressure in your arteries as the heart relaxes. What are the causes? The exact cause of this condition is not known. There are some conditions that result in or are related to high blood pressure. What increases the risk? Some risk factors for high blood pressure are under your control. The following factors may make you more likely to develop this condition:  Smoking.  Having type 2 diabetes mellitus, high cholesterol, or both.  Not getting enough exercise or physical activity.  Being overweight.  Having too much fat, sugar, calories, or salt (sodium) in your diet.  Drinking too much alcohol. Some risk factors for high blood pressure may be difficult or impossible to change. Some of these factors include:  Having chronic kidney disease.  Having a family history of high blood pressure.  Age. Risk increases with age.  Race. You may be at higher risk if you are African American.  Gender. Men are at higher risk than women before age 45. After age 65, women are at higher risk than men.  Having obstructive sleep apnea.  Stress. What are the signs or symptoms? High blood pressure may not cause symptoms. Very high blood pressure (hypertensive crisis) may cause:  Headache.  Anxiety.  Shortness of breath.  Nosebleed.  Nausea and vomiting.  Vision changes.  Severe chest pain.  Seizures. How is this diagnosed? This condition is diagnosed by measuring your blood pressure while you are seated, with your arm resting on a flat surface, your legs uncrossed, and your feet flat on the floor. The cuff of the blood pressure monitor will be placed directly against the skin of  your upper arm at the level of your heart. It should be measured at least twice using the same arm. Certain conditions can cause a difference in blood pressure between your right and left arms. Certain factors can cause blood pressure readings to be lower or higher than normal for a short period of time:  When your blood pressure is higher when you are in a health care provider's office than when you are at home, this is called white coat hypertension. Most people with this condition do not need medicines.  When your blood pressure is higher at home than when you are in a health care provider's office, this is called masked hypertension.   Most people with this condition may need medicines to control blood pressure. If you have a high blood pressure reading during one visit or you have normal blood pressure with other risk factors, you may be asked to:  Return on a different day to have your blood pressure checked again.  Monitor your blood pressure at home for 1 week or longer. If you are diagnosed with hypertension, you may have other blood or imaging tests to help your health care provider understand your overall risk for other conditions. How is this treated? This condition is treated by making healthy lifestyle changes, such as eating healthy foods, exercising more, and reducing your alcohol intake. Your health care provider may prescribe medicine if lifestyle changes are not enough to get your blood pressure under control, and if:  Your systolic blood pressure is above 130.  Your diastolic blood pressure is above 80. Your personal target blood pressure may vary depending on your medical conditions, your age, and other factors. Follow these instructions at home: Eating and drinking   Eat a diet that is high in fiber and potassium, and low in sodium, added sugar, and fat. An example eating plan is called the DASH (Dietary Approaches to Stop Hypertension) diet. To eat this way: ? Eat plenty  of fresh fruits and vegetables. Try to fill one half of your plate at each meal with fruits and vegetables. ? Eat whole grains, such as whole-wheat pasta, brown rice, or whole-grain bread. Fill about one fourth of your plate with whole grains. ? Eat or drink low-fat dairy products, such as skim milk or low-fat yogurt. ? Avoid fatty cuts of meat, processed or cured meats, and poultry with skin. Fill about one fourth of your plate with lean proteins, such as fish, chicken without skin, beans, eggs, or tofu. ? Avoid pre-made and processed foods. These tend to be higher in sodium, added sugar, and fat.  Reduce your daily sodium intake. Most people with hypertension should eat less than 1,500 mg of sodium a day.  Do not drink alcohol if: ? Your health care provider tells you not to drink. ? You are pregnant, may be pregnant, or are planning to become pregnant.  If you drink alcohol: ? Limit how much you use to:  0-1 drink a day for women.  0-2 drinks a day for men. ? Be aware of how much alcohol is in your drink. In the [place] one drink equals one 12 oz bottle of beer (355 mL), one 5 oz glass of wine (148 mL), or one 1 oz glass of hard liquor (44 mL). Lifestyle   Work with your health care provider to maintain a healthy body weight or to lose weight. Ask what an ideal weight is for you.  Get at least 30 minutes of exercise most days of the week. Activities may include walking, swimming, or biking.  Include exercise to strengthen your muscles (resistance exercise), such as Pilates or lifting weights, as part of your weekly exercise routine. Try to do these types of exercises for 30 minutes at least 3 days a week.  Do not use any products that contain nicotine or tobacco, such as cigarettes, e-cigarettes, and chewing tobacco. If you need help quitting, ask your health care provider.  Monitor your blood pressure at home as told by your health care provider.  Keep all follow-up visits as told  by your health care provider. This is important. Medicines  Take over-the-counter and prescription medicines only as told by   your health care provider. Follow directions carefully. Blood pressure medicines must be taken as prescribed.  Do not skip doses of blood pressure medicine. Doing this puts you at risk for problems and can make the medicine less effective.  Ask your health care provider about side effects or reactions to medicines that you should watch for. Contact a health care provider if you:  Think you are having a reaction to a medicine you are taking.  Have headaches that keep coming back (recurring).  Feel dizzy.  Have swelling in your ankles.  Have trouble with your vision. Get help right away if you:  Develop a severe headache or confusion.  Have unusual weakness or numbness.  Feel faint.  Have severe pain in your chest or abdomen.  Vomit repeatedly.  Have trouble breathing. Summary  Hypertension is when the force of blood pumping through your arteries is too strong. If this condition is not controlled, it may put you at risk for serious complications.  Your personal target blood pressure may vary depending on your medical conditions, your age, and other factors. For most people, a normal blood pressure is less than 120/80.  Hypertension is treated with lifestyle changes, medicines, or a combination of both. Lifestyle changes include losing weight, eating a healthy, low-sodium diet, exercising more, and limiting alcohol. This information is not intended to replace advice given to you by your health care provider. Make sure you discuss any questions you have with your health care provider. Document Revised: 02/03/2018 Document Reviewed: 02/03/2018 Elsevier Patient Education  2020 Elsevier Inc.  

## 2019-11-08 NOTE — Assessment & Plan Note (Signed)
Elevated blood pressure.  Patient noncompliant with medications.  Advised to take medications on a daily basis.  Diet and nutrition discussed as well as importance of physical exercise.  Blood work done today. Follow-up in 6 months.

## 2019-11-09 ENCOUNTER — Other Ambulatory Visit: Payer: Self-pay | Admitting: Emergency Medicine

## 2019-11-09 ENCOUNTER — Encounter: Payer: Self-pay | Admitting: Radiology

## 2019-11-09 DIAGNOSIS — E785 Hyperlipidemia, unspecified: Secondary | ICD-10-CM

## 2019-11-09 LAB — CBC
Hematocrit: 41.6 % (ref 34.0–46.6)
Hemoglobin: 13.5 g/dL (ref 11.1–15.9)
MCH: 29.3 pg (ref 26.6–33.0)
MCHC: 32.5 g/dL (ref 31.5–35.7)
MCV: 90 fL (ref 79–97)
Platelets: 359 10*3/uL (ref 150–450)
RBC: 4.61 x10E6/uL (ref 3.77–5.28)
RDW: 13.1 % (ref 11.7–15.4)
WBC: 6 10*3/uL (ref 3.4–10.8)

## 2019-11-09 LAB — CMP14+EGFR
ALT: 17 IU/L (ref 0–32)
AST: 23 IU/L (ref 0–40)
Albumin/Globulin Ratio: 1.5 (ref 1.2–2.2)
Albumin: 4.6 g/dL (ref 3.8–4.9)
Alkaline Phosphatase: 53 IU/L (ref 48–121)
BUN/Creatinine Ratio: 11 (ref 9–23)
BUN: 10 mg/dL (ref 6–24)
Bilirubin Total: 0.5 mg/dL (ref 0.0–1.2)
CO2: 26 mmol/L (ref 20–29)
Calcium: 9.9 mg/dL (ref 8.7–10.2)
Chloride: 100 mmol/L (ref 96–106)
Creatinine, Ser: 0.91 mg/dL (ref 0.57–1.00)
GFR calc Af Amer: 80 mL/min/{1.73_m2} (ref >59)
GFR calc non Af Amer: 69 mL/min/{1.73_m2} (ref >59)
Globulin, Total: 3.1 g/dL (ref 1.5–4.5)
Glucose: 84 mg/dL (ref 65–99)
Potassium: 3.7 mmol/L (ref 3.5–5.2)
Sodium: 140 mmol/L (ref 134–144)
Total Protein: 7.7 g/dL (ref 6.0–8.5)

## 2019-11-09 LAB — LIPID PANEL
Chol/HDL Ratio: 4.2 ratio (ref 0.0–4.4)
Cholesterol, Total: 225 mg/dL — ABNORMAL HIGH (ref 100–199)
HDL: 54 mg/dL (ref >39)
LDL Chol Calc (NIH): 151 mg/dL — ABNORMAL HIGH (ref 0–99)
Triglycerides: 112 mg/dL (ref 0–149)
VLDL Cholesterol Cal: 20 mg/dL (ref 5–40)

## 2019-11-09 LAB — THYROID PANEL WITH TSH
Free Thyroxine Index: 2.5 (ref 1.2–4.9)
T3 Uptake Ratio: 28 % (ref 24–39)
T4, Total: 9 ug/dL (ref 4.5–12.0)
TSH: 0.019 u[IU]/mL — ABNORMAL LOW (ref 0.450–4.500)

## 2019-11-09 MED ORDER — ROSUVASTATIN CALCIUM 10 MG PO TABS: 10.0000 mg | ORAL_TABLET | Freq: Every day | ORAL | 3 refills | Status: DC

## 2019-11-24 ENCOUNTER — Encounter: Payer: Self-pay | Admitting: Emergency Medicine

## 2019-12-07 ENCOUNTER — Telehealth: Payer: Self-pay | Admitting: Emergency Medicine

## 2019-12-07 NOTE — Telephone Encounter (Signed)
Please F/U with this and reach out to pt.

## 2019-12-07 NOTE — Telephone Encounter (Signed)
Left message in mobile voice mail the only preventative screening form is dated 10/31/2018.

## 2019-12-07 NOTE — Telephone Encounter (Signed)
Pt called and was wondering if provider has filled out the Pre Preventive paperwork that she has him sign for every CPE that pt has. Pt would like a call regarding this. Please advise.

## 2020-06-02 ENCOUNTER — Other Ambulatory Visit: Payer: Self-pay | Admitting: Emergency Medicine

## 2020-06-02 DIAGNOSIS — I10 Essential (primary) hypertension: Secondary | ICD-10-CM

## 2020-06-03 NOTE — Telephone Encounter (Signed)
Requested medication (s) are due for refill today: yes  Requested medication (s) are on the active medication list: yes  Last refill:  11/08/19  Future visit scheduled: no  Notes to clinic:  over due labs/needs appt   Requested Prescriptions  Pending Prescriptions Disp Refills   lisinopril-hydrochlorothiazide (ZESTORETIC) 20-12.5 MG tablet [Pharmacy Med Name: LISINOPRIL-HCTZ 20-12.5 MG TAB] 90 tablet 1    Sig: TAKE 1 TABLET BY MOUTH EVERY DAY      Cardiovascular:  ACEI + Diuretic Combos Failed - 06/02/2020 12:37 AM      Failed - Na in normal range and within 180 days    Sodium  Date Value Ref Range Status  11/08/2019 140 134 - 144 mmol/L Final          Failed - K in normal range and within 180 days    Potassium  Date Value Ref Range Status  11/08/2019 3.7 3.5 - 5.2 mmol/L Final          Failed - Cr in normal range and within 180 days    Creat  Date Value Ref Range Status  07/10/2015 0.90 0.50 - 1.05 mg/dL Final   Creatinine, Ser  Date Value Ref Range Status  11/08/2019 0.91 0.57 - 1.00 mg/dL Final          Failed - Ca in normal range and within 180 days    Calcium  Date Value Ref Range Status  11/08/2019 9.9 8.7 - 10.2 mg/dL Final          Failed - Last BP in normal range    BP Readings from Last 1 Encounters:  11/08/19 (!) 159/97          Failed - Valid encounter within last 6 months    Recent Outpatient Visits           6 months ago Routine general medical examination at a health care facility   [facility], Ines Bloomer, MD   1 year ago Routine general medical examination at a health care facility   [facility], Ines Bloomer, MD   2 years ago Essential hypertension   [facility] [place], Ines Bloomer, MD   3 years ago Encounter for general adult medical examination with abnormal findings   [facility], Ines Bloomer, MD   4 years ago Essential hypertension   [facility] [place], MD                Passed - Patient is not pregnant

## 2020-06-05 ENCOUNTER — Other Ambulatory Visit: Payer: Self-pay

## 2020-06-05 ENCOUNTER — Telehealth: Payer: Self-pay | Admitting: Emergency Medicine

## 2020-06-05 NOTE — Telephone Encounter (Signed)
Noted in chart as discontinued due to patient preference

## 2020-06-05 NOTE — Telephone Encounter (Signed)
Pt called to let provider know that she is not taking  rosuvastatin (CRESTOR) 10 MG tablet [[id number] n or \ metoprolol succinate (TOPROL-XL) 25 MG 24 hr tablet [[id number]]   Pt is only taking  Lisinopril and levothytoxine

## 2020-11-13 ENCOUNTER — Ambulatory Visit (INDEPENDENT_AMBULATORY_CARE_PROVIDER_SITE_OTHER): Payer: Commercial Managed Care - PPO | Admitting: Emergency Medicine

## 2020-11-13 ENCOUNTER — Other Ambulatory Visit: Payer: Self-pay

## 2020-11-13 ENCOUNTER — Encounter: Payer: Self-pay | Admitting: Emergency Medicine

## 2020-11-13 VITALS — BP 138/84 | HR 78 | Temp 98.3°F | Ht 65.5 in | Wt 198.0 lb

## 2020-11-13 DIAGNOSIS — E079 Disorder of thyroid, unspecified: Secondary | ICD-10-CM

## 2020-11-13 DIAGNOSIS — Z130 Encounter for screening for diseases of the blood and blood-forming organs and certain disorders involving the immune mechanism: Secondary | ICD-10-CM

## 2020-11-13 DIAGNOSIS — Z0000 Encounter for general adult medical examination without abnormal findings: Secondary | ICD-10-CM

## 2020-11-13 DIAGNOSIS — Z13228 Encounter for screening for other metabolic disorders: Secondary | ICD-10-CM

## 2020-11-13 DIAGNOSIS — Z1211 Encounter for screening for malignant neoplasm of colon: Secondary | ICD-10-CM

## 2020-11-13 DIAGNOSIS — I10 Essential (primary) hypertension: Secondary | ICD-10-CM

## 2020-11-13 DIAGNOSIS — Z1329 Encounter for screening for other suspected endocrine disorder: Secondary | ICD-10-CM

## 2020-11-13 DIAGNOSIS — Z13220 Encounter for screening for lipoid disorders: Secondary | ICD-10-CM

## 2020-11-13 MED ORDER — LEVOTHYROXINE SODIUM 150 MCG PO TABS: 150.0000 ug | ORAL_TABLET | Freq: Every day | ORAL | 3 refills | Status: DC

## 2020-11-13 MED ORDER — LISINOPRIL-HYDROCHLOROTHIAZIDE 20-12.5 MG PO TABS: 1.0000 | ORAL_TABLET | Freq: Every day | ORAL | 3 refills | Status: DC

## 2020-11-13 NOTE — Progress Notes (Signed)
Grace [name] 60 y.o.   Chief Complaint  Patient presents with  . Annual Exam  . Medication Refill    pend    HISTORY OF PRESENT ILLNESS: This is a 60 y.o. female here for annual exam. Has the following chronic medical problems: 1.  Hypertension: On Zestoretic 20-12.5 mg daily.  Normal blood pressure readings at home. 2.  Hypothyroidism postoperative: On levothyroxine 150 mcg daily. Has no complaints or medical concerns today. Normal mammogram done last year. Needs referral for colonoscopy.  HPI   Prior to Admission medications   Medication Sig Start Date End Date Taking? Authorizing Provider  levothyroxine (SYNTHROID) 150 MCG tablet Take 1 tablet (150 mcg total) by mouth daily. 11/08/19   Grace Pollen, MD  lisinopril-hydrochlorothiazide (ZESTORETIC) 20-12.5 MG tablet TAKE 1 TABLET BY MOUTH EVERY DAY 06/04/20   Grace Pollen, MD  Multiple Vitamins-Minerals (MULTIVITAMIN PO) Take 1 tablet by mouth daily.    [provider]    Allergies  Allergen Reactions  . Penicillins Other (See Comments)    Sisters are allergic Has patient had a PCN reaction causing immediate rash, facial/tongue/throat swelling, SOB or lightheadedness with hypotension: Unknown Has patient had a PCN reaction causing severe rash involving mucus membranes or skin necrosis: Unknown Has patient had a PCN reaction that required hospitalization: Unknown Has patient had a PCN reaction occurring within the last 10 years: Unknown If all of the above answers are "NO", then may proceed with Cephalosporin use.   . Amlodipine Swelling    Patient Active Problem List   Diagnosis Date Noted  . Multinodular goiter 12/25/2017  . Hyperlipidemia 07/10/2015  . Hearing loss 07/10/2015  . Hypertension 12/23/2011  . Goiter 12/23/2011  . Fibroids 12/23/2011    Past Medical History:  Diagnosis Date  . Goiter 12/23/2011   multi nodule  . HOH (hard of hearing)   . Hypertension   . Wears partial  dentures     Past Surgical History:  Procedure Laterality Date  . CESAREAN SECTION    . CYST EXCISION     thigh  . MULTIPLE TOOTH EXTRACTIONS    . THYROIDECTOMY Bilateral 12/25/2017   Procedure: TOTAL THYROIDECTOMY;  Surgeon: Grace Quitter, MD;  Location: [facility];  Service: ENT;  Laterality: Bilateral;  . TUBAL LIGATION      Social History   Socioeconomic History  . Marital status: Married    Spouse name: Not on file  . Number of children: Not on file  . Years of education: Not on file  . Highest education level: Not on file  Occupational History  . Not on file  Tobacco Use  . Smoking status: Never Smoker  . Smokeless tobacco: Never Used  Vaping Use  . Vaping Use: Never used  Substance and Sexual Activity  . Alcohol use: No  . Drug use: No  . Sexual activity: Yes  Other Topics Concern  . Not on file  Social History Narrative  . Not on file   Social Determinants of Health   Financial Resource Strain: Not on file  Food Insecurity: Not on file  Transportation Needs: Not on file  Physical Activity: Not on file  Stress: Not on file  Social Connections: Not on file  Intimate Partner Violence: Not on file    Family History  Problem Relation Age of Onset  . Hypertension Mother   . Alzheimer's disease Mother   . Hypertension Father      Review of Systems  Constitutional: Negative.  Negative  for chills and fever.  HENT: Negative.  Negative for congestion and sore throat.   Respiratory: Negative.  Negative for cough and shortness of breath.   Cardiovascular: Negative.  Negative for chest pain and palpitations.  Gastrointestinal: Negative.  Negative for abdominal pain, diarrhea, nausea and vomiting.  Genitourinary: Negative.   Skin: Negative.  Negative for rash.  Neurological: Negative.  Negative for dizziness and headaches.  All other systems reviewed and are negative.   Today's Vitals   11/13/20 0952  BP: (!) 140/100  Pulse: 78  Temp: 98.3 F (36.8 C)   TempSrc: Oral  SpO2: 98%  Weight: 198 lb (89.8 kg)  Height: 5' 5.5" (1.664 m)   Body mass index is 32.45 kg/m. Wt Readings from Last 3 Encounters:  11/13/20 198 lb (89.8 kg)  11/08/19 205 lb (93 kg)  11/04/18 182 lb 9.6 oz (82.8 kg)    Physical Exam Vitals reviewed.  Constitutional:      Appearance: Normal appearance.  HENT:     Head: Normocephalic.     Right Ear: Tympanic membrane, ear canal and external ear normal.     Left Ear: Tympanic membrane, ear canal and external ear normal.     Mouth/Throat:     Mouth: Mucous membranes are moist.     Pharynx: Oropharynx is clear.  Eyes:     Extraocular Movements: Extraocular movements intact.     Conjunctiva/sclera: Conjunctivae normal.     Pupils: Pupils are equal, round, and reactive to light.  Neck:     Vascular: No carotid bruit.  Cardiovascular:     Rate and Rhythm: Normal rate and regular rhythm.     Pulses: Normal pulses.     Heart sounds: Normal heart sounds.  Pulmonary:     Effort: Pulmonary effort is normal.     Breath sounds: Normal breath sounds.  Abdominal:     General: Bowel sounds are normal. There is no distension.     Palpations: Abdomen is soft.     Tenderness: There is no abdominal tenderness.  Musculoskeletal:     Cervical back: Normal range of motion and neck supple. No tenderness.     Right lower leg: No edema.     Left lower leg: No edema.  Lymphadenopathy:     Cervical: No cervical adenopathy.  Skin:    General: Skin is warm and dry.     Capillary Refill: Capillary refill takes less than 2 seconds.  Neurological:     General: No focal deficit present.     Mental Status: She is alert and oriented to person, place, and time.  Psychiatric:        Mood and Affect: Mood normal.        Behavior: Behavior normal.      ASSESSMENT & PLAN: Grace [name] was seen today for annual exam and medication refill.  Diagnoses and all orders for this visit:  Routine general medical examination at a health care  facility  Thyroid disease -     levothyroxine (SYNTHROID) 150 MCG tablet; Take 1 tablet (150 mcg total) by mouth daily. -     TSH  Essential hypertension -     lisinopril-hydrochlorothiazide (ZESTORETIC) 20-12.5 MG tablet; Take 1 tablet by mouth daily. -     CBC with Differential/Platelet -     Comprehensive metabolic panel -     Hemoglobin A1c -     Lipid panel  Colon cancer screening -     Ambulatory referral to Gastroenterology  Screening for  deficiency anemia  Screening for lipoid disorders  Screening for endocrine, metabolic and immunity disorder   Modifiable risk factors discussed with patient. Anticipatory guidance according to age provided. The following topics were discussed: Smoking Hypertension and hypothyroidism.  Medication review. Need to continue monitoring blood pressure readings at home and keep a log Diet and nutrition and need to reduce amount of daily carbohydrates in her diet Benefits of exercise Cancer screening and need for colon cancer screening with colonoscopy. Mammogram report from last year reviewed.  Normal. Vaccinations Cardiovascular risk assessment Mental health including depression and anxiety Fall and accident prevention   Patient Instructions     Health Maintenance, Female Adopting a healthy lifestyle and getting preventive care are important in promoting health and wellness. Ask your health care provider about:  The right schedule for you to have regular tests and exams.  Things you can do on your own to prevent diseases and keep yourself healthy. What should I know about diet, weight, and exercise? Eat a healthy diet  Eat a diet that includes plenty of vegetables, fruits, low-fat dairy products, and lean protein.  Do not eat a lot of foods that are high in solid fats, added sugars, or sodium.   Maintain a healthy weight Body mass index (BMI) is used to identify weight problems. It estimates body fat based on height and  weight. Your health care provider can help determine your BMI and help you achieve or maintain a healthy weight. Get regular exercise Get regular exercise. This is one of the most important things you can do for your health. Most adults should:  Exercise for at least 150 minutes each week. The exercise should increase your heart rate and make you sweat (moderate-intensity exercise).  Do strengthening exercises at least twice a week. This is in addition to the moderate-intensity exercise.  Spend less time sitting. Even light physical activity can be beneficial. Watch cholesterol and blood lipids Have your blood tested for lipids and cholesterol at 60 years of age, then have this test every 5 years. Have your cholesterol levels checked more often if:  Your lipid or cholesterol levels are high.  You are older than 61 years of age.  You are at high risk for heart disease. What should I know about cancer screening? Depending on your health history and family history, you may need to have cancer screening at various ages. This may include screening for:  Breast cancer.  Cervical cancer.  Colorectal cancer.  Skin cancer.  Lung cancer. What should I know about heart disease, diabetes, and high blood pressure? Blood pressure and heart disease  High blood pressure causes heart disease and increases the risk of stroke. This is more likely to develop in people who have high blood pressure readings, are of African descent, or are overweight.  Have your blood pressure checked: ? Every 3-5 years if you are 45-67 years of age. ? Every year if you are 5 years old or older. Diabetes Have regular diabetes screenings. This checks your fasting blood sugar level. Have the screening done:  Once every three years after age 63 if you are at a normal weight and have a low risk for diabetes.  More often and at a younger age if you are overweight or have a high risk for diabetes. What should I know  about preventing infection? Hepatitis B If you have a higher risk for hepatitis B, you should be screened for this virus. Talk with your health care provider to  find out if you are at risk for hepatitis B infection. Hepatitis C Testing is recommended for:  Everyone born from [birth year] through 1966.  Anyone with known risk factors for hepatitis C. Sexually transmitted infections (STIs)  Get screened for STIs, including gonorrhea and chlamydia, if: ? You are sexually active and are younger than 61 years of age. ? You are older than 61 years of age and your health care provider tells you that you are at risk for this type of infection. ? Your sexual activity has changed since you were last screened, and you are at increased risk for chlamydia or gonorrhea. Ask your health care provider if you are at risk.  Ask your health care provider about whether you are at high risk for HIV. Your health care provider may recommend a prescription medicine to help prevent HIV infection. If you choose to take medicine to prevent HIV, you should first get tested for HIV. You should then be tested every 3 months for as long as you are taking the medicine. Pregnancy  If you are about to stop having your period (premenopausal) and you may become pregnant, seek counseling before you get pregnant.  Take 400 to 800 micrograms (mcg) of folic acid every day if you become pregnant.  Ask for birth control (contraception) if you want to prevent pregnancy. Osteoporosis and menopause Osteoporosis is a disease in which the bones lose minerals and strength with aging. This can result in bone fractures. If you are 37 years old or older, or if you are at risk for osteoporosis and fractures, ask your health care provider if you should:  Be screened for bone loss.  Take a calcium or vitamin D supplement to lower your risk of fractures.  Be given hormone replacement therapy (HRT) to treat symptoms of menopause. Follow these  instructions at home: Lifestyle  Do not use any products that contain nicotine or tobacco, such as cigarettes, e-cigarettes, and chewing tobacco. If you need help quitting, ask your health care provider.  Do not use street drugs.  Do not share needles.  Ask your health care provider for help if you need support or information about quitting drugs. Alcohol use  Do not drink alcohol if: ? Your health care provider tells you not to drink. ? You are pregnant, may be pregnant, or are planning to become pregnant.  If you drink alcohol: ? Limit how much you use to 0-1 drink a day. ? Limit intake if you are breastfeeding.  Be aware of how much alcohol is in your drink. In the [place] one drink equals one 12 oz bottle of beer (355 mL), one 5 oz glass of wine (148 mL), or one 1 oz glass of hard liquor (44 mL). General instructions  Schedule regular health, dental, and eye exams.  Stay current with your vaccines.  Tell your health care provider if: ? You often feel depressed. ? You have ever been abused or do not feel safe at home. Summary  Adopting a healthy lifestyle and getting preventive care are important in promoting health and wellness.  Follow your health care provider's instructions about healthy diet, exercising, and getting tested or screened for diseases.  Follow your health care provider's instructions on monitoring your cholesterol and blood pressure. This information is not intended to replace advice given to you by your health care provider. Make sure you discuss any questions you have with your health care provider. Document Revised: 05/19/2018 Document Reviewed: 05/19/2018 Elsevier Patient Education  2021 [place], MD [facility]

## 2020-11-13 NOTE — Patient Instructions (Signed)
Health Maintenance, Female Adopting a healthy lifestyle and getting preventive care are important in promoting health and wellness. Ask your health care provider about:  The right schedule for you to have regular tests and exams.  Things you can do on your own to prevent diseases and keep yourself healthy. What should I know about diet, weight, and exercise? Eat a healthy diet  Eat a diet that includes plenty of vegetables, fruits, low-fat dairy products, and lean protein.  Do not eat a lot of foods that are high in solid fats, added sugars, or sodium.   Maintain a healthy weight Body mass index (BMI) is used to identify weight problems. It estimates body fat based on height and weight. Your health care provider can help determine your BMI and help you achieve or maintain a healthy weight. Get regular exercise Get regular exercise. This is one of the most important things you can do for your health. Most adults should:  Exercise for at least 150 minutes each week. The exercise should increase your heart rate and make you sweat (moderate-intensity exercise).  Do strengthening exercises at least twice a week. This is in addition to the moderate-intensity exercise.  Spend less time sitting. Even light physical activity can be beneficial. Watch cholesterol and blood lipids Have your blood tested for lipids and cholesterol at 60 years of age, then have this test every 5 years. Have your cholesterol levels checked more often if:  Your lipid or cholesterol levels are high.  You are older than 61 years of age.  You are at high risk for heart disease. What should I know about cancer screening? Depending on your health history and family history, you may need to have cancer screening at various ages. This may include screening for:  Breast cancer.  Cervical cancer.  Colorectal cancer.  Skin cancer.  Lung cancer. What should I know about heart disease, diabetes, and high blood  pressure? Blood pressure and heart disease  High blood pressure causes heart disease and increases the risk of stroke. This is more likely to develop in people who have high blood pressure readings, are of African descent, or are overweight.  Have your blood pressure checked: ? Every 3-5 years if you are 18-39 years of age. ? Every year if you are 40 years old or older. Diabetes Have regular diabetes screenings. This checks your fasting blood sugar level. Have the screening done:  Once every three years after age 40 if you are at a normal weight and have a low risk for diabetes.  More often and at a younger age if you are overweight or have a high risk for diabetes. What should I know about preventing infection? Hepatitis B If you have a higher risk for hepatitis B, you should be screened for this virus. Talk with your health care provider to find out if you are at risk for hepatitis B infection. Hepatitis C Testing is recommended for:  Everyone born from 1945 through 1965.  Anyone with known risk factors for hepatitis C. Sexually transmitted infections (STIs)  Get screened for STIs, including gonorrhea and chlamydia, if: ? You are sexually active and are younger than 61 years of age. ? You are older than 61 years of age and your health care provider tells you that you are at risk for this type of infection. ? Your sexual activity has changed since you were last screened, and you are at increased risk for chlamydia or gonorrhea. Ask your health care provider   if you are at risk.  Ask your health care provider about whether you are at high risk for HIV. Your health care provider may recommend a prescription medicine to help prevent HIV infection. If you choose to take medicine to prevent HIV, you should first get tested for HIV. You should then be tested every 3 months for as long as you are taking the medicine. Pregnancy  If you are about to stop having your period (premenopausal) and  you may become pregnant, seek counseling before you get pregnant.  Take 400 to 800 micrograms (mcg) of folic acid every day if you become pregnant.  Ask for birth control (contraception) if you want to prevent pregnancy. Osteoporosis and menopause Osteoporosis is a disease in which the bones lose minerals and strength with aging. This can result in bone fractures. If you are 65 years old or older, or if you are at risk for osteoporosis and fractures, ask your health care provider if you should:  Be screened for bone loss.  Take a calcium or vitamin D supplement to lower your risk of fractures.  Be given hormone replacement therapy (HRT) to treat symptoms of menopause. Follow these instructions at home: Lifestyle  Do not use any products that contain nicotine or tobacco, such as cigarettes, e-cigarettes, and chewing tobacco. If you need help quitting, ask your health care provider.  Do not use street drugs.  Do not share needles.  Ask your health care provider for help if you need support or information about quitting drugs. Alcohol use  Do not drink alcohol if: ? Your health care provider tells you not to drink. ? You are pregnant, may be pregnant, or are planning to become pregnant.  If you drink alcohol: ? Limit how much you use to 0-1 drink a day. ? Limit intake if you are breastfeeding.  Be aware of how much alcohol is in your drink. In the [place] one drink equals one 12 oz bottle of beer (355 mL), one 5 oz glass of wine (148 mL), or one 1 oz glass of hard liquor (44 mL). General instructions  Schedule regular health, dental, and eye exams.  Stay current with your vaccines.  Tell your health care provider if: ? You often feel depressed. ? You have ever been abused or do not feel safe at home. Summary  Adopting a healthy lifestyle and getting preventive care are important in promoting health and wellness.  Follow your health care provider's instructions about healthy  diet, exercising, and getting tested or screened for diseases.  Follow your health care provider's instructions on monitoring your cholesterol and blood pressure. This information is not intended to replace advice given to you by your health care provider. Make sure you discuss any questions you have with your health care provider. Document Revised: 05/19/2018 Document Reviewed: 05/19/2018 Elsevier Patient Education  2021 [employer]  

## 2020-11-14 ENCOUNTER — Other Ambulatory Visit (INDEPENDENT_AMBULATORY_CARE_PROVIDER_SITE_OTHER): Payer: Commercial Managed Care - PPO

## 2020-11-14 ENCOUNTER — Other Ambulatory Visit: Payer: Self-pay | Admitting: Emergency Medicine

## 2020-11-14 DIAGNOSIS — Z1329 Encounter for screening for other suspected endocrine disorder: Secondary | ICD-10-CM

## 2020-11-14 DIAGNOSIS — Z130 Encounter for screening for diseases of the blood and blood-forming organs and certain disorders involving the immune mechanism: Secondary | ICD-10-CM

## 2020-11-14 DIAGNOSIS — Z13220 Encounter for screening for lipoid disorders: Secondary | ICD-10-CM

## 2020-11-14 DIAGNOSIS — Z13228 Encounter for screening for other metabolic disorders: Secondary | ICD-10-CM | POA: Diagnosis not present

## 2020-11-14 LAB — COMPREHENSIVE METABOLIC PANEL
ALT: 14 U/L (ref 0–35)
AST: 19 U/L (ref 0–37)
Albumin: 4.3 g/dL (ref 3.5–5.2)
Alkaline Phosphatase: 39 U/L (ref 39–117)
BUN: 13 mg/dL (ref 6–23)
CO2: 31 mEq/L (ref 19–32)
Calcium: 9.9 mg/dL (ref 8.4–10.5)
Chloride: 101 mEq/L (ref 96–112)
Creatinine, Ser: 1.07 mg/dL (ref 0.40–1.20)
GFR: 56.32 mL/min — ABNORMAL LOW (ref >60.00)
Glucose, Bld: 89 mg/dL (ref 70–99)
Potassium: 3.7 mEq/L (ref 3.5–5.1)
Sodium: 138 mEq/L (ref 135–145)
Total Bilirubin: 0.4 mg/dL (ref 0.2–1.2)
Total Protein: 8 g/dL (ref 6.0–8.3)

## 2020-11-14 LAB — LIPID PANEL
Cholesterol: 216 mg/dL — ABNORMAL HIGH (ref 0–200)
HDL: 55.5 mg/dL (ref >39.00)
LDL Cholesterol: 143 mg/dL — ABNORMAL HIGH (ref 0–99)
NonHDL: 160.01
Total CHOL/HDL Ratio: 4
Triglycerides: 86 mg/dL (ref 0.0–149.0)
VLDL: 17.2 mg/dL (ref 0.0–40.0)

## 2020-11-14 LAB — CBC WITH DIFFERENTIAL/PLATELET
Basophils Absolute: 0 10*3/uL (ref 0.0–0.1)
Basophils Relative: 0.4 % (ref 0.0–3.0)
Eosinophils Absolute: 0.1 10*3/uL (ref 0.0–0.7)
Eosinophils Relative: 1.4 % (ref 0.0–5.0)
HCT: 40.4 % (ref 36.0–46.0)
Hemoglobin: 13.6 g/dL (ref 12.0–15.0)
Lymphocytes Relative: 47.1 % — ABNORMAL HIGH (ref 12.0–46.0)
Lymphs Abs: 3.1 10*3/uL (ref 0.7–4.0)
MCHC: 33.7 g/dL (ref 30.0–36.0)
MCV: 87.7 fl (ref 78.0–100.0)
Monocytes Absolute: 0.6 10*3/uL (ref 0.1–1.0)
Monocytes Relative: 8.3 % (ref 3.0–12.0)
Neutro Abs: 2.8 10*3/uL (ref 1.4–7.7)
Neutrophils Relative %: 42.8 % — ABNORMAL LOW (ref 43.0–77.0)
Platelets: 350 10*3/uL (ref 150.0–400.0)
RBC: 4.6 Mil/uL (ref 3.87–5.11)
RDW: 14.2 % (ref 11.5–15.5)
WBC: 6.6 10*3/uL (ref 4.0–10.5)

## 2020-11-14 LAB — TSH: TSH: 0.55 u[IU]/mL (ref 0.35–4.50)

## 2020-11-14 LAB — HEMOGLOBIN A1C: Hgb A1c MFr Bld: 5.9 % (ref 4.6–6.5)

## 2020-11-15 NOTE — Progress Notes (Signed)
Left message mobile voice mail with lab results.

## 2021-11-02 ENCOUNTER — Other Ambulatory Visit: Payer: Self-pay | Admitting: Emergency Medicine

## 2021-11-02 DIAGNOSIS — I10 Essential (primary) hypertension: Secondary | ICD-10-CM

## 2021-11-18 ENCOUNTER — Encounter: Payer: Commercial Managed Care - PPO | Admitting: Emergency Medicine

## 2021-11-18 ENCOUNTER — Other Ambulatory Visit: Payer: Self-pay | Admitting: Emergency Medicine

## 2021-11-18 DIAGNOSIS — E079 Disorder of thyroid, unspecified: Secondary | ICD-10-CM

## 2021-12-02 ENCOUNTER — Other Ambulatory Visit: Payer: Self-pay | Admitting: Emergency Medicine

## 2021-12-02 DIAGNOSIS — E079 Disorder of thyroid, unspecified: Secondary | ICD-10-CM

## 2021-12-03 ENCOUNTER — Ambulatory Visit (INDEPENDENT_AMBULATORY_CARE_PROVIDER_SITE_OTHER): Payer: Commercial Managed Care - PPO | Admitting: Emergency Medicine

## 2021-12-03 ENCOUNTER — Encounter: Payer: Self-pay | Admitting: Emergency Medicine

## 2021-12-03 VITALS — BP 128/84 | HR 89 | Temp 98.2°F | Ht 65.0 in | Wt 196.5 lb

## 2021-12-03 DIAGNOSIS — Z1211 Encounter for screening for malignant neoplasm of colon: Secondary | ICD-10-CM | POA: Diagnosis not present

## 2021-12-03 DIAGNOSIS — Z130 Encounter for screening for diseases of the blood and blood-forming organs and certain disorders involving the immune mechanism: Secondary | ICD-10-CM

## 2021-12-03 DIAGNOSIS — Z1329 Encounter for screening for other suspected endocrine disorder: Secondary | ICD-10-CM

## 2021-12-03 DIAGNOSIS — Z0000 Encounter for general adult medical examination without abnormal findings: Secondary | ICD-10-CM | POA: Diagnosis not present

## 2021-12-03 DIAGNOSIS — I10 Essential (primary) hypertension: Secondary | ICD-10-CM

## 2021-12-03 DIAGNOSIS — Z13228 Encounter for screening for other metabolic disorders: Secondary | ICD-10-CM

## 2021-12-03 DIAGNOSIS — E042 Nontoxic multinodular goiter: Secondary | ICD-10-CM

## 2021-12-03 DIAGNOSIS — E785 Hyperlipidemia, unspecified: Secondary | ICD-10-CM

## 2021-12-03 DIAGNOSIS — Z13220 Encounter for screening for lipoid disorders: Secondary | ICD-10-CM

## 2021-12-03 LAB — LIPID PANEL
Cholesterol: 217 mg/dL — ABNORMAL HIGH (ref 0–200)
HDL: 52.6 mg/dL (ref >39.00)
LDL Cholesterol: 145 mg/dL — ABNORMAL HIGH (ref 0–99)
NonHDL: 163.95
Total CHOL/HDL Ratio: 4
Triglycerides: 96 mg/dL (ref 0.0–149.0)
VLDL: 19.2 mg/dL (ref 0.0–40.0)

## 2021-12-03 LAB — COMPREHENSIVE METABOLIC PANEL
ALT: 15 U/L (ref 0–35)
AST: 19 U/L (ref 0–37)
Albumin: 4.3 g/dL (ref 3.5–5.2)
Alkaline Phosphatase: 38 U/L — ABNORMAL LOW (ref 39–117)
BUN: 13 mg/dL (ref 6–23)
CO2: 32 mEq/L (ref 19–32)
Calcium: 10.4 mg/dL (ref 8.4–10.5)
Chloride: 101 mEq/L (ref 96–112)
Creatinine, Ser: 1.06 mg/dL (ref 0.40–1.20)
GFR: 56.54 mL/min — ABNORMAL LOW (ref >60.00)
Glucose, Bld: 88 mg/dL (ref 70–99)
Potassium: 3.7 mEq/L (ref 3.5–5.1)
Sodium: 139 mEq/L (ref 135–145)
Total Bilirubin: 0.5 mg/dL (ref 0.2–1.2)
Total Protein: 8 g/dL (ref 6.0–8.3)

## 2021-12-03 LAB — CBC WITH DIFFERENTIAL/PLATELET
Basophils Absolute: 0.1 10*3/uL (ref 0.0–0.1)
Basophils Relative: 1 % (ref 0.0–3.0)
Eosinophils Absolute: 0.2 10*3/uL (ref 0.0–0.7)
Eosinophils Relative: 2.9 % (ref 0.0–5.0)
HCT: 39.9 % (ref 36.0–46.0)
Hemoglobin: 13.2 g/dL (ref 12.0–15.0)
Lymphocytes Relative: 44.8 % (ref 12.0–46.0)
Lymphs Abs: 2.5 10*3/uL (ref 0.7–4.0)
MCHC: 33.2 g/dL (ref 30.0–36.0)
MCV: 88.1 fl (ref 78.0–100.0)
Monocytes Absolute: 0.5 10*3/uL (ref 0.1–1.0)
Monocytes Relative: 8.7 % (ref 3.0–12.0)
Neutro Abs: 2.3 10*3/uL (ref 1.4–7.7)
Neutrophils Relative %: 42.6 % — ABNORMAL LOW (ref 43.0–77.0)
Platelets: 331 10*3/uL (ref 150.0–400.0)
RBC: 4.53 Mil/uL (ref 3.87–5.11)
RDW: 13.5 % (ref 11.5–15.5)
WBC: 5.5 10*3/uL (ref 4.0–10.5)

## 2021-12-03 LAB — HEMOGLOBIN A1C: Hgb A1c MFr Bld: 5.9 % (ref 4.6–6.5)

## 2021-12-03 LAB — TSH: TSH: 0.03 u[IU]/mL — ABNORMAL LOW (ref 0.35–5.50)

## 2021-12-04 ENCOUNTER — Other Ambulatory Visit: Payer: Self-pay | Admitting: Emergency Medicine

## 2021-12-04 DIAGNOSIS — E785 Hyperlipidemia, unspecified: Secondary | ICD-10-CM

## 2021-12-04 MED ORDER — ROSUVASTATIN CALCIUM 10 MG PO TABS: 10.0000 mg | ORAL_TABLET | Freq: Every day | ORAL | 3 refills | Status: AC

## 2022-02-27 ENCOUNTER — Other Ambulatory Visit: Payer: Self-pay | Admitting: Emergency Medicine

## 2022-02-27 DIAGNOSIS — E079 Disorder of thyroid, unspecified: Secondary | ICD-10-CM

## 2022-05-25 ENCOUNTER — Other Ambulatory Visit: Payer: Self-pay | Admitting: Emergency Medicine

## 2022-05-25 DIAGNOSIS — E079 Disorder of thyroid, unspecified: Secondary | ICD-10-CM

## 2022-08-21 ENCOUNTER — Other Ambulatory Visit: Payer: Self-pay | Admitting: Emergency Medicine

## 2022-08-21 DIAGNOSIS — E079 Disorder of thyroid, unspecified: Secondary | ICD-10-CM

## 2022-11-17 ENCOUNTER — Other Ambulatory Visit: Payer: Self-pay | Admitting: Emergency Medicine

## 2022-11-17 DIAGNOSIS — I10 Essential (primary) hypertension: Secondary | ICD-10-CM

## 2022-11-22 ENCOUNTER — Other Ambulatory Visit: Payer: Self-pay | Admitting: Emergency Medicine

## 2022-11-22 DIAGNOSIS — E079 Disorder of thyroid, unspecified: Secondary | ICD-10-CM

## 2023-02-23 ENCOUNTER — Other Ambulatory Visit: Payer: Self-pay | Admitting: Emergency Medicine

## 2023-02-23 DIAGNOSIS — E079 Disorder of thyroid, unspecified: Secondary | ICD-10-CM

## 2023-08-18 ENCOUNTER — Other Ambulatory Visit: Payer: Self-pay | Admitting: Emergency Medicine

## 2023-08-18 DIAGNOSIS — E079 Disorder of thyroid, unspecified: Secondary | ICD-10-CM

## 2023-10-08 ENCOUNTER — Encounter: Payer: Self-pay | Admitting: Emergency Medicine

## 2023-10-08 ENCOUNTER — Ambulatory Visit (INDEPENDENT_AMBULATORY_CARE_PROVIDER_SITE_OTHER): Payer: Commercial Managed Care - PPO | Admitting: Emergency Medicine

## 2023-10-08 VITALS — BP 126/90 | HR 85 | Temp 98.4°F | Ht 65.0 in | Wt 196.0 lb

## 2023-10-08 DIAGNOSIS — Z13228 Encounter for screening for other metabolic disorders: Secondary | ICD-10-CM

## 2023-10-08 DIAGNOSIS — Z0000 Encounter for general adult medical examination without abnormal findings: Secondary | ICD-10-CM

## 2023-10-08 DIAGNOSIS — Z131 Encounter for screening for diabetes mellitus: Secondary | ICD-10-CM

## 2023-10-08 DIAGNOSIS — Z1329 Encounter for screening for other suspected endocrine disorder: Secondary | ICD-10-CM | POA: Diagnosis not present

## 2023-10-08 DIAGNOSIS — Z1211 Encounter for screening for malignant neoplasm of colon: Secondary | ICD-10-CM

## 2023-10-08 DIAGNOSIS — Z130 Encounter for screening for diseases of the blood and blood-forming organs and certain disorders involving the immune mechanism: Secondary | ICD-10-CM | POA: Diagnosis not present

## 2023-10-08 DIAGNOSIS — Z13220 Encounter for screening for lipoid disorders: Secondary | ICD-10-CM | POA: Diagnosis not present

## 2023-10-08 LAB — LIPID PANEL
Cholesterol: 212 mg/dL — ABNORMAL HIGH (ref 0–200)
HDL: 56.8 mg/dL (ref >39.00)
LDL Cholesterol: 141 mg/dL — ABNORMAL HIGH (ref 0–99)
NonHDL: 155.21
Total CHOL/HDL Ratio: 4
Triglycerides: 73 mg/dL (ref 0.0–149.0)
VLDL: 14.6 mg/dL (ref 0.0–40.0)

## 2023-10-08 LAB — HEMOGLOBIN A1C: Hgb A1c MFr Bld: 5.9 % (ref 4.6–6.5)

## 2023-10-08 LAB — HM MAMMOGRAPHY

## 2023-10-08 LAB — COMPREHENSIVE METABOLIC PANEL WITH GFR
ALT: 22 U/L (ref 0–35)
AST: 27 U/L (ref 0–37)
Albumin: 4.3 g/dL (ref 3.5–5.2)
Alkaline Phosphatase: 37 U/L — ABNORMAL LOW (ref 39–117)
BUN: 11 mg/dL (ref 6–23)
CO2: 29 meq/L (ref 19–32)
Calcium: 10.1 mg/dL (ref 8.4–10.5)
Chloride: 102 meq/L (ref 96–112)
Creatinine, Ser: 1.03 mg/dL (ref 0.40–1.20)
GFR: 57.77 mL/min — ABNORMAL LOW (ref >60.00)
Glucose, Bld: 92 mg/dL (ref 70–99)
Potassium: 3.5 meq/L (ref 3.5–5.1)
Sodium: 138 meq/L (ref 135–145)
Total Bilirubin: 0.5 mg/dL (ref 0.2–1.2)
Total Protein: 7.8 g/dL (ref 6.0–8.3)

## 2023-10-08 LAB — CBC WITH DIFFERENTIAL/PLATELET
Basophils Absolute: 0 10*3/uL (ref 0.0–0.1)
Basophils Relative: 0.8 % (ref 0.0–3.0)
Eosinophils Absolute: 0.1 10*3/uL (ref 0.0–0.7)
Eosinophils Relative: 2.7 % (ref 0.0–5.0)
HCT: 39.1 % (ref 36.0–46.0)
Hemoglobin: 13.2 g/dL (ref 12.0–15.0)
Lymphocytes Relative: 45 % (ref 12.0–46.0)
Lymphs Abs: 2.3 10*3/uL (ref 0.7–4.0)
MCHC: 33.7 g/dL (ref 30.0–36.0)
MCV: 88.2 fl (ref 78.0–100.0)
Monocytes Absolute: 0.4 10*3/uL (ref 0.1–1.0)
Monocytes Relative: 8.6 % (ref 3.0–12.0)
Neutro Abs: 2.2 10*3/uL (ref 1.4–7.7)
Neutrophils Relative %: 42.9 % — ABNORMAL LOW (ref 43.0–77.0)
Platelets: 326 10*3/uL (ref 150.0–400.0)
RBC: 4.44 Mil/uL (ref 3.87–5.11)
RDW: 13.7 % (ref 11.5–15.5)
WBC: 5.1 10*3/uL (ref 4.0–10.5)

## 2023-10-08 LAB — TSH: TSH: 0.17 u[IU]/mL — ABNORMAL LOW (ref 0.35–5.50)

## 2023-10-08 NOTE — Progress Notes (Signed)
 Grace [name] 63 y.o.   Chief Complaint  Patient presents with   Annual Exam    Patient here for physical. She states no other concerns    HISTORY OF PRESENT ILLNESS: This is a 63 y.o. female here for annual exam. History of hypertension and hypothyroidism Overall doing well.  Has no complaints or medical concerns today.  HPI   Prior to Admission medications   Medication Sig Start Date End Date Taking? Authorizing Provider  levothyroxine  (SYNTHROID ) 150 MCG tablet TAKE 1 TABLET BY MOUTH EVERY DAY 08/18/23  Yes Khristi Schiller, Isidro Margo, MD  lisinopril -hydrochlorothiazide  (ZESTORETIC ) 20-12.5 MG tablet TAKE 1 TABLET BY MOUTH EVERY DAY 11/17/22  Yes Ashima Shrake Jose, MD  Multiple Vitamins-Minerals (MULTIVITAMIN PO) Take 1 tablet by mouth daily.   Yes [provider]  rosuvastatin  (CRESTOR ) 10 MG tablet Take 1 tablet (10 mg total) by mouth daily. Patient not taking: Reported on 10/08/2023 12/04/21   Elvira Hammersmith, MD    Allergies  Allergen Reactions   Penicillins Other (See Comments)    Sisters are allergic Has patient had a PCN reaction causing immediate rash, facial/tongue/throat swelling, SOB or lightheadedness with hypotension: Unknown Has patient had a PCN reaction causing severe rash involving mucus membranes or skin necrosis: Unknown Has patient had a PCN reaction that required hospitalization: Unknown Has patient had a PCN reaction occurring within the last 10 years: Unknown If all of the above answers are "NO", then may proceed with Cephalosporin use.    Amlodipine  Swelling    Patient Active Problem List   Diagnosis Date Noted   Multinodular goiter 12/25/2017   Hyperlipidemia 07/10/2015   Hearing loss 07/10/2015   Hypertension 12/23/2011   Goiter 12/23/2011   Fibroids 12/23/2011    Past Medical History:  Diagnosis Date   Goiter 12/23/2011   multi nodule   HOH (hard of hearing)    Hypertension    Wears partial dentures     Past Surgical  History:  Procedure Laterality Date   CESAREAN SECTION     CYST EXCISION     thigh   MULTIPLE TOOTH EXTRACTIONS     THYROIDECTOMY Bilateral 12/25/2017   Procedure: TOTAL THYROIDECTOMY;  Surgeon: Virgina Grills, MD;  Location: [facility] OR;  Service: ENT;  Laterality: Bilateral;   TUBAL LIGATION      Social History   Socioeconomic History   Marital status: Married    Spouse name: Not on file   Number of children: Not on file   Years of education: Not on file   Highest education level: Not on file  Occupational History   Not on file  Tobacco Use   Smoking status: Never   Smokeless tobacco: Never  Vaping Use   Vaping status: Never Used  Substance and Sexual Activity   Alcohol use: No   Drug use: No   Sexual activity: Yes  Other Topics Concern   Not on file  Social History Narrative   Not on file   Social Drivers of Health   Financial Resource Strain: Not on file  Food Insecurity: Not on file  Transportation Needs: Not on file  Physical Activity: Not on file  Stress: Not on file  Social Connections: Not on file  Intimate Partner Violence: Not on file    Family History  Problem Relation Age of Onset   Hypertension Mother    Alzheimer's disease Mother    Hypertension Father      Review of Systems  Constitutional: Negative.  Negative for chills  and fever.  HENT: Negative.  Negative for congestion and sore throat.   Respiratory: Negative.  Negative for cough and shortness of breath.   Cardiovascular: Negative.  Negative for chest pain and palpitations.  Gastrointestinal:  Negative for abdominal pain, diarrhea, nausea and vomiting.  Genitourinary: Negative.  Negative for dysuria and hematuria.  Skin: Negative.  Negative for rash.  Neurological: Negative.  Negative for dizziness and headaches.  All other systems reviewed and are negative.   Vitals:   10/08/23 0840  BP: (!) 126/90  Pulse: 85  Temp: 98.4 F (36.9 C)  SpO2: 96%    Physical Exam Vitals reviewed.   Constitutional:      Appearance: Normal appearance.  HENT:     Head: Normocephalic.     Right Ear: Tympanic membrane, ear canal and external ear normal.     Left Ear: Ear canal and external ear normal.     Mouth/Throat:     Mouth: Mucous membranes are moist.     Pharynx: Oropharynx is clear.  Eyes:     Extraocular Movements: Extraocular movements intact.     Conjunctiva/sclera: Conjunctivae normal.     Pupils: Pupils are equal, round, and reactive to light.  Cardiovascular:     Rate and Rhythm: Normal rate and regular rhythm.     Pulses: Normal pulses.     Heart sounds: Normal heart sounds.  Pulmonary:     Effort: Pulmonary effort is normal.     Breath sounds: Normal breath sounds.  Abdominal:     Palpations: Abdomen is soft.     Tenderness: There is no abdominal tenderness.  Musculoskeletal:     Cervical back: No tenderness.     Right lower leg: No edema.     Left lower leg: No edema.  Lymphadenopathy:     Cervical: No cervical adenopathy.  Skin:    General: Skin is warm and dry.     Capillary Refill: Capillary refill takes less than 2 seconds.  Neurological:     General: No focal deficit present.     Mental Status: She is alert and oriented to person, place, and time.  Psychiatric:        Mood and Affect: Mood normal.        Behavior: Behavior normal.      ASSESSMENT & PLAN: Problem List Items Addressed This Visit   None Visit Diagnoses       Routine general medical examination at a health care facility    -  Primary   Relevant Orders   TSH   Comprehensive metabolic panel with GFR   CBC with Differential/Platelet   Lipid panel   Hemoglobin A1c     Colon cancer screening       Relevant Orders   Cologuard     Screening for deficiency anemia       Relevant Orders   CBC with Differential/Platelet     Screening for lipoid disorders       Relevant Orders   Lipid panel     Screening for endocrine, metabolic and immunity disorder       Relevant Orders    TSH   Comprehensive metabolic panel with GFR   Hemoglobin A1c      Modifiable risk factors discussed with patient. Anticipatory guidance according to age provided. The following topics were also discussed: Social Determinants of Health Smoking.  Non-smoker Diet and nutrition Benefits of exercise Cancer screening and need for colon and breast cancer screening.  Scheduled for mammogram  today.  Chooses Cologuard today. Vaccinations review and recommendations Cardiovascular risk assessment and need for blood work The 10-year ASCVD risk score (Arnett DK, et al., 2019) is: 8.6%   Values used to calculate the score:     Age: 10 years     Sex: Female     Is Non-Hispanic African American: Yes     Diabetic: No     Tobacco smoker: No     Systolic Blood Pressure: 126 mmHg     Is BP treated: Yes     HDL Cholesterol: 52.6 mg/dL     Total Cholesterol: 217 mg/dL  Mental health including depression and anxiety Fall and accident prevention  Patient Instructions  Health Maintenance, Female Adopting a healthy lifestyle and getting preventive care are important in promoting health and wellness. Ask your health care provider about: The right schedule for you to have regular tests and exams. Things you can do on your own to prevent diseases and keep yourself healthy. What should I know about diet, weight, and exercise? Eat a healthy diet  Eat a diet that includes plenty of vegetables, fruits, low-fat dairy products, and lean protein. Do not eat a lot of foods that are high in solid fats, added sugars, or sodium. Maintain a healthy weight Body mass index (BMI) is used to identify weight problems. It estimates body fat based on height and weight. Your health care provider can help determine your BMI and help you achieve or maintain a healthy weight. Get regular exercise Get regular exercise. This is one of the most important things you can do for your health. Most adults should: Exercise for at  least 150 minutes each week. The exercise should increase your heart rate and make you sweat (moderate-intensity exercise). Do strengthening exercises at least twice a week. This is in addition to the moderate-intensity exercise. Spend less time sitting. Even light physical activity can be beneficial. Watch cholesterol and blood lipids Have your blood tested for lipids and cholesterol at 64 years of age, then have this test every 5 years. Have your cholesterol levels checked more often if: Your lipid or cholesterol levels are high. You are older than 64 years of age. You are at high risk for heart disease. What should I know about cancer screening? Depending on your health history and family history, you may need to have cancer screening at various ages. This may include screening for: Breast cancer. Cervical cancer. Colorectal cancer. Skin cancer. Lung cancer. What should I know about heart disease, diabetes, and high blood pressure? Blood pressure and heart disease High blood pressure causes heart disease and increases the risk of stroke. This is more likely to develop in people who have high blood pressure readings or are overweight. Have your blood pressure checked: Every 3-5 years if you are 1-3 years of age. Every year if you are 28 years old or older. Diabetes Have regular diabetes screenings. This checks your fasting blood sugar level. Have the screening done: Once every three years after age 85 if you are at a normal weight and have a low risk for diabetes. More often and at a younger age if you are overweight or have a high risk for diabetes. What should I know about preventing infection? Hepatitis B If you have a higher risk for hepatitis B, you should be screened for this virus. Talk with your health care provider to find out if you are at risk for hepatitis B infection. Hepatitis C Testing is recommended for: Everyone  born from [birth year] through 1966. Anyone with known risk  factors for hepatitis C. Sexually transmitted infections (STIs) Get screened for STIs, including gonorrhea and chlamydia, if: You are sexually active and are younger than 64 years of age. You are older than 64 years of age and your health care provider tells you that you are at risk for this type of infection. Your sexual activity has changed since you were last screened, and you are at increased risk for chlamydia or gonorrhea. Ask your health care provider if you are at risk. Ask your health care provider about whether you are at high risk for HIV. Your health care provider may recommend a prescription medicine to help prevent HIV infection. If you choose to take medicine to prevent HIV, you should first get tested for HIV. You should then be tested every 3 months for as long as you are taking the medicine. Pregnancy If you are about to stop having your period (premenopausal) and you may become pregnant, seek counseling before you get pregnant. Take 400 to 800 micrograms (mcg) of folic acid every day if you become pregnant. Ask for birth control (contraception) if you want to prevent pregnancy. Osteoporosis and menopause Osteoporosis is a disease in which the bones lose minerals and strength with aging. This can result in bone fractures. If you are 90 years old or older, or if you are at risk for osteoporosis and fractures, ask your health care provider if you should: Be screened for bone loss. Take a calcium  or vitamin D supplement to lower your risk of fractures. Be given hormone replacement therapy (HRT) to treat symptoms of menopause. Follow these instructions at home: Alcohol use Do not drink alcohol if: Your health care provider tells you not to drink. You are pregnant, may be pregnant, or are planning to become pregnant. If you drink alcohol: Limit how much you have to: 0-1 drink a day. Know how much alcohol is in your drink. In the [place] one drink equals one 12 oz bottle of beer  (355 mL), one 5 oz glass of wine (148 mL), or one 1 oz glass of hard liquor (44 mL). Lifestyle Do not use any products that contain nicotine or tobacco. These products include cigarettes, chewing tobacco, and vaping devices, such as e-cigarettes. If you need help quitting, ask your health care provider. Do not use street drugs. Do not share needles. Ask your health care provider for help if you need support or information about quitting drugs. General instructions Schedule regular health, dental, and eye exams. Stay current with your vaccines. Tell your health care provider if: You often feel depressed. You have ever been abused or do not feel safe at home. Summary Adopting a healthy lifestyle and getting preventive care are important in promoting health and wellness. Follow your health care provider's instructions about healthy diet, exercising, and getting tested or screened for diseases. Follow your health care provider's instructions on monitoring your cholesterol and blood pressure. This information is not intended to replace advice given to you by your health care provider. Make sure you discuss any questions you have with your health care provider. Document Revised: 10/15/2020 Document Reviewed: 10/15/2020 Elsevier Patient Education  2024 Elsevier Inc.     Maryagnes Small, MD [facility]

## 2023-10-08 NOTE — Patient Instructions (Signed)

## 2023-10-09 ENCOUNTER — Encounter: Payer: Self-pay | Admitting: Emergency Medicine

## 2023-10-12 ENCOUNTER — Telehealth: Payer: Self-pay | Admitting: Emergency Medicine

## 2023-10-12 ENCOUNTER — Encounter: Payer: Self-pay | Admitting: Radiology

## 2023-10-12 NOTE — Telephone Encounter (Signed)
 Copied from CRM [id number]. Topic: Clinical - Lab/Test Results >> Oct 12, 2023  3:09 PM Magdalene School wrote: Reason for CRM: patient returning call. I informed her of the notes left under her labs, she stated that she received her lab results and her mammogram results on Friday 10/09/23 and she is unsure if there is anything else they need to discuss with her.

## 2023-11-04 LAB — COLOGUARD: COLOGUARD: NEGATIVE

## 2023-11-05 ENCOUNTER — Ambulatory Visit: Payer: Self-pay | Admitting: Emergency Medicine

## 2023-11-13 ENCOUNTER — Other Ambulatory Visit: Payer: Self-pay | Admitting: Emergency Medicine

## 2023-11-13 DIAGNOSIS — I10 Essential (primary) hypertension: Secondary | ICD-10-CM

## 2023-11-14 ENCOUNTER — Other Ambulatory Visit: Payer: Self-pay | Admitting: Emergency Medicine

## 2023-11-14 DIAGNOSIS — E079 Disorder of thyroid, unspecified: Secondary | ICD-10-CM

## 2024-02-11 ENCOUNTER — Other Ambulatory Visit: Payer: Self-pay | Admitting: Emergency Medicine

## 2024-02-11 DIAGNOSIS — E079 Disorder of thyroid, unspecified: Secondary | ICD-10-CM

## 2024-03-04 ENCOUNTER — Ambulatory Visit
Admission: EM | Admit: 2024-03-04 | Discharge: 2024-03-04 | Disposition: A | Discharge status: Home / Self Care | Attending: Family Medicine | Admitting: Family Medicine

## 2024-03-04 DIAGNOSIS — R350 Frequency of micturition: Secondary | ICD-10-CM | POA: Diagnosis present

## 2024-03-04 DIAGNOSIS — N3001 Acute cystitis with hematuria: Secondary | ICD-10-CM | POA: Diagnosis present

## 2024-03-04 LAB — POCT URINE DIPSTICK
Bilirubin, UA: NEGATIVE
Glucose, UA: NEGATIVE mg/dL
Ketones, POC UA: NEGATIVE mg/dL
Leukocytes, UA: NEGATIVE
Nitrite, UA: NEGATIVE
Spec Grav, UA: 1.02 (ref 1.010–1.025)
Urobilinogen, UA: 0.2 U/dL
pH, UA: 5.5 (ref 5.0–8.0)

## 2024-03-04 MED ORDER — CEPHALEXIN 500 MG PO CAPS: 500.0000 mg | ORAL_CAPSULE | Freq: Two times a day (BID) | ORAL | 0 refills | Status: AC

## 2024-03-04 NOTE — ED Triage Notes (Signed)
 Pt reports increase urinary frequency since lats night.

## 2024-03-04 NOTE — ED Provider Notes (Signed)
 [facility]  Note:  This document was prepared using Conservation officer, historic buildings and may include unintentional dictation errors.  MRN: [id number] DOB: [birth year]  Subjective:   Grace [name] is a 63 y.o. female presenting for 1 day history of urinary frequency. Denies fever, n/v, abdominal pain, pelvic pain, rashes, dysuria, hematuria, vaginal discharge.  Was holding her urine while at work. Does not hydrate consistently. Also drinks a lot of sweet tea daily.   No current facility-administered medications for this encounter.  Current Outpatient Medications:    lisinopril -hydrochlorothiazide  (ZESTORETIC ) 20-12.5 MG tablet, TAKE 1 TABLET BY MOUTH EVERY DAY, Disp: 90 tablet, Rfl: 3   Multiple Vitamins-Minerals (MULTIVITAMIN PO), Take 1 tablet by mouth daily., Disp: , Rfl:    rosuvastatin  (CRESTOR ) 10 MG tablet, Take 1 tablet (10 mg total) by mouth daily. (Patient not taking: Reported on 10/08/2023), Disp: 90 tablet, Rfl: 3   UNITHROID  150 MCG tablet, Take 1 tablet (150 mcg total) by mouth daily before breakfast., Disp: 90 tablet, Rfl: 3   Allergies  Allergen Reactions   Penicillins Other (See Comments)    Sisters are allergic Has patient had a PCN reaction causing immediate rash, facial/tongue/throat swelling, SOB or lightheadedness with hypotension: Unknown Has patient had a PCN reaction causing severe rash involving mucus membranes or skin necrosis: Unknown Has patient had a PCN reaction that required hospitalization: Unknown Has patient had a PCN reaction occurring within the last 10 years: Unknown If all of the above answers are NO, then may proceed with Cephalosporin use.    Amlodipine  Swelling    Past Medical History:  Diagnosis Date   Goiter 12/23/2011   multi nodule   HOH (hard of hearing)    Hypertension    Wears partial dentures      Past Surgical History:  Procedure Laterality Date   CESAREAN SECTION     CYST EXCISION     thigh    MULTIPLE TOOTH EXTRACTIONS     THYROIDECTOMY Bilateral 12/25/2017   Procedure: TOTAL THYROIDECTOMY;  Surgeon: Carlie Clark, MD;  Location: [facility] OR;  Service: ENT;  Laterality: Bilateral;   TUBAL LIGATION      Family History  Problem Relation Age of Onset   Hypertension Mother    Alzheimer's disease Mother    Hypertension Father     Social History   Tobacco Use   Smoking status: Never   Smokeless tobacco: Never  Vaping Use   Vaping status: Never Used  Substance Use Topics   Alcohol use: No   Drug use: Never    ROS   Objective:   Vitals: BP (!) 140/94 (BP Location: Left Arm)   Pulse 86   Temp 99.3 F (37.4 C) (Oral)   Resp 16   LMP 04/10/2012   SpO2 95%   Physical Exam Constitutional:      General: She is not in acute distress.    Appearance: Normal appearance. She is well-developed. She is not ill-appearing, toxic-appearing or diaphoretic.  HENT:     Head: Normocephalic and atraumatic.     Nose: Nose normal.     Mouth/Throat:     Mouth: Mucous membranes are moist.  Eyes:     General: No scleral icterus.       Right eye: No discharge.        Left eye: No discharge.     Extraocular Movements: Extraocular movements intact.     Conjunctiva/sclera: Conjunctivae normal.  Cardiovascular:     Rate and Rhythm:  Normal rate.  Pulmonary:     Effort: Pulmonary effort is normal.  Abdominal:     General: Bowel sounds are normal. There is no distension.     Palpations: Abdomen is soft. There is no mass.     Tenderness: There is no abdominal tenderness. There is no right CVA tenderness, left CVA tenderness, guarding or rebound.  Skin:    General: Skin is warm and dry.  Neurological:     General: No focal deficit present.     Mental Status: She is alert and oriented to person, place, and time.  Psychiatric:        Mood and Affect: Mood normal.        Behavior: Behavior normal.        Thought Content: Thought content normal.        Judgment: Judgment normal.     Results for orders placed or performed during the hospital encounter of 03/04/24 (from the past 24 hours)  POCT URINE DIPSTICK     Status: Abnormal   Collection Time: 03/04/24  6:40 PM  Result Value Ref Range   Color, UA yellow yellow   Clarity, UA hazy (A) clear   Glucose, UA negative negative mg/dL   Bilirubin, UA negative negative   Ketones, POC UA negative negative mg/dL   Spec Grav, UA 8.979 8.989 - 1.025   Blood, UA large (A) negative   pH, UA 5.5 5.0 - 8.0   POC PROTEIN,UA trace negative, trace   Urobilinogen, UA 0.2 0.2 or 1.0 E.U./dL   Nitrite, UA Negative Negative   Leukocytes, UA Negative Negative    Assessment and Plan :   PDMP not reviewed this encounter.  1. Acute cystitis with hematuria   2. Urinary frequency    Start cephalexin  to cover for acute cystitis, urine culture pending.  Recommended aggressive hydration, limiting urinary irritants. Counseled patient on potential for adverse effects with medications prescribed/recommended today, ER and return-to-clinic precautions discussed, patient verbalized understanding.    Christopher Savannah, [place] 03/04/24 [phone]

## 2024-03-04 NOTE — Discharge Instructions (Addendum)
 Please start cephalexin  to address an urinary tract infection. Make sure you hydrate very well with plain water and a quantity of 80 ounces of water a day.  Please limit drinks that are considered urinary irritants such as fruit juices, soda, sweet tea, coffee, artifical sweetened drinks, energy drinks, alcohol.  These can worsen your urinary and genital symptoms but also be the source of them.  I will let you know about your urine culture results through MyChart to see if we need to prescribe or change your antibiotics based off of those results.

## 2024-03-05 LAB — URINE CULTURE: Culture: <10000 — AB

## 2024-03-07 ENCOUNTER — Ambulatory Visit (HOSPITAL_COMMUNITY): Payer: Self-pay

## 2024-10-11 ENCOUNTER — Encounter: Admitting: Emergency Medicine
